# Patient Record
Sex: Male | Born: 1985
Health system: Southern US, Community
[De-identification: ages and names within clinical notes are randomized; demographics above are authoritative.]

## PROBLEM LIST (undated history)

## (undated) DIAGNOSIS — F1020 Alcohol dependence, uncomplicated: Secondary | ICD-10-CM

## (undated) DIAGNOSIS — F1120 Opioid dependence, uncomplicated: Secondary | ICD-10-CM

---
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
## 2011-10-24 ENCOUNTER — Emergency Department: Payer: Self-pay | Admitting: Emergency Medicine

## 2012-03-09 ENCOUNTER — Emergency Department: Payer: Self-pay | Admitting: Internal Medicine

## 2012-03-09 LAB — DRUG SCREEN, URINE
Barbiturates, Ur Screen: NEGATIVE (ref ?–200)
Cannabinoid 50 Ng, Ur ~~LOC~~: POSITIVE (ref ?–50)
Cocaine Metabolite,Ur ~~LOC~~: NEGATIVE (ref ?–300)
MDMA (Ecstasy)Ur Screen: NEGATIVE (ref ?–500)
Opiate, Ur Screen: NEGATIVE (ref ?–300)
Phencyclidine (PCP) Ur S: NEGATIVE (ref ?–25)
Tricyclic, Ur Screen: NEGATIVE (ref ?–1000)

## 2012-03-09 LAB — CBC
MCH: 32.7 pg (ref 26.0–34.0)
MCHC: 34 g/dL (ref 32.0–36.0)
MCV: 96 fL (ref 80–100)
Platelet: 216 10*3/uL (ref 150–440)
RBC: 4.41 10*6/uL (ref 4.40–5.90)
RDW: 12.6 % (ref 11.5–14.5)
WBC: 7.8 10*3/uL (ref 3.8–10.6)

## 2012-03-09 LAB — COMPREHENSIVE METABOLIC PANEL
Albumin: 4.5 g/dL (ref 3.4–5.0)
Alkaline Phosphatase: 84 U/L (ref 50–136)
Calcium, Total: 9.1 mg/dL (ref 8.5–10.1)
Co2: 25 mmol/L (ref 21–32)
EGFR (Non-African Amer.): >60
Glucose: 74 mg/dL (ref 65–99)
SGOT(AST): 25 U/L (ref 15–37)
SGPT (ALT): 22 U/L (ref 12–78)

## 2012-03-09 LAB — TROPONIN I: Troponin-I: <0.02 ng/mL

## 2016-06-05 DIAGNOSIS — F1510 Other stimulant abuse, uncomplicated: Secondary | ICD-10-CM | POA: Insufficient documentation

## 2016-06-05 NOTE — ED Notes (Signed)
Brother and brother in law brought pt to ED; they say pt has not been expressing any suicidal thoughts but he has been very agitated; they don't believe he will become violent but they also think it will be a good idea to have someone close by in case; officer on duty standing outside triage door for triage nurse and patient safety

## 2016-06-05 NOTE — ED Triage Notes (Signed)
Patient reports needing help with drug addiction.  Reports he has been drinking alcohol and smoking pot.  Last night used "molly" and "esctasy".

## 2016-06-06 ENCOUNTER — Emergency Department
Admission: EM | Admit: 2016-06-06 | Discharge: 2016-06-06 | Disposition: A | Discharge status: Home / Self Care | Payer: Self-pay | Attending: Emergency Medicine | Admitting: Emergency Medicine

## 2016-06-06 DIAGNOSIS — F1910 Other psychoactive substance abuse, uncomplicated: Secondary | ICD-10-CM

## 2016-06-06 DIAGNOSIS — F1510 Other stimulant abuse, uncomplicated: Secondary | ICD-10-CM

## 2016-06-06 NOTE — ED Notes (Signed)
BEHAVIORAL HEALTH ROUNDING  Patient sleeping: No.  Patient alert and oriented: yes  Behavior appropriate: Yes. ; If no, describe:  Nutrition and fluids offered: Yes  Toileting and hygiene offered: Yes  Sitter present: not applicable, Q 15 min safety rounds and observation.  Law enforcement present: Yes ODS  
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
## 2016-06-06 NOTE — ED Notes (Signed)
Pt. Changed out in triage by Susy FrizzleMatt, RN.

## 2016-06-06 NOTE — ED Notes (Signed)

## 2016-06-06 NOTE — BH Assessment (Signed)
Per EDP Dr.Stafford the patient does not require consultation. TTS Consult has been discontinued and and pt will be discharged.  Clinician did speak with pt. Pt reports h/o substance abuse (THC, Fentanyl, Alcohol, Molly) and is requesting assistance obtaining tx. Pt denies SI/HI/Psychosis. Clinician contacted [facility]([place]) and verified lack of bed availability prior to Tuesday. Clinician obtained verbal consent to contact [facility]) and provide information for coordination of treatment. Clinician counseled patient and provided him with information and instructions on accessing substance abuse treatment centers including [facility], [facility], [facility], [facility]. Clinician also informed patient of how to contact insurance panel for treatment referral.

## 2016-06-06 NOTE — ED Notes (Signed)
Pt ambulatory to room with no difficulty. Pt reports hx of addiction to fentanyl for which he was treated with suboxone and did well for a while. Pt reports he then started drinking alcohol while taking the suboxone and then he recently started using fentanyl again. Pt also reports he used molly last night. Pt requesting assistance to get of the drugs. Pt calm and cooperative at this time and understanding of the process for evaluation.

## 2017-05-31 ENCOUNTER — Ambulatory Visit (HOSPITAL_COMMUNITY): Payer: Self-pay | Admitting: Psychiatry

## 2017-06-28 ENCOUNTER — Ambulatory Visit (HOSPITAL_COMMUNITY): Payer: Self-pay | Admitting: Psychiatry

## 2017-07-25 ENCOUNTER — Ambulatory Visit (HOSPITAL_COMMUNITY): Payer: Self-pay | Admitting: Psychiatry

## 2017-07-25 ENCOUNTER — Encounter

## 2017-08-19 ENCOUNTER — Ambulatory Visit (HOSPITAL_COMMUNITY): Payer: Self-pay | Admitting: Psychiatry

## 2017-08-19 ENCOUNTER — Encounter

## 2017-12-29 ENCOUNTER — Emergency Department
Admission: EM | Admit: 2017-12-29 | Discharge: 2017-12-29 | Disposition: A | Discharge status: Home / Self Care | Payer: Commercial Managed Care - PPO | Attending: Emergency Medicine | Admitting: Emergency Medicine

## 2017-12-29 ENCOUNTER — Other Ambulatory Visit: Payer: Self-pay

## 2017-12-29 ENCOUNTER — Encounter: Payer: Self-pay | Admitting: Emergency Medicine

## 2017-12-29 DIAGNOSIS — S0502XA Injury of conjunctiva and corneal abrasion without foreign body, left eye, initial encounter: Secondary | ICD-10-CM | POA: Insufficient documentation

## 2017-12-29 DIAGNOSIS — F17210 Nicotine dependence, cigarettes, uncomplicated: Secondary | ICD-10-CM | POA: Diagnosis not present

## 2017-12-29 DIAGNOSIS — Y999 Unspecified external cause status: Secondary | ICD-10-CM | POA: Diagnosis not present

## 2017-12-29 DIAGNOSIS — S0592XA Unspecified injury of left eye and orbit, initial encounter: Secondary | ICD-10-CM | POA: Diagnosis present

## 2017-12-29 DIAGNOSIS — Y929 Unspecified place or not applicable: Secondary | ICD-10-CM | POA: Insufficient documentation

## 2017-12-29 DIAGNOSIS — Y9389 Activity, other specified: Secondary | ICD-10-CM | POA: Diagnosis not present

## 2017-12-29 DIAGNOSIS — W208XXA Other cause of strike by thrown, projected or falling object, initial encounter: Secondary | ICD-10-CM | POA: Insufficient documentation

## 2017-12-29 MED ORDER — EYE WASH OPHTH SOLN
1.0000 [drp] | OPHTHALMIC | Status: DC | PRN
  Filled 2017-12-29: qty 118

## 2017-12-29 MED ORDER — TETRACAINE HCL 0.5 % OP SOLN
1.0000 [drp] | Freq: Once | OPHTHALMIC | Status: DC
  Filled 2017-12-29: qty 4

## 2017-12-29 MED ORDER — TOBRAMYCIN 0.3 % OP SOLN: 2.0000 [drp] | OPHTHALMIC | 0 refills | Status: DC

## 2017-12-29 MED ORDER — FLUORESCEIN SODIUM 1 MG OP STRP
1.0000 | ORAL_STRIP | Freq: Once | OPHTHALMIC | Status: DC
  Filled 2017-12-29: qty 1

## 2017-12-29 NOTE — Discharge Instructions (Addendum)
Follow-up with Dr. Brooke Dare in [facility] if any continued problems with your eye in 24 hours.  Begin using the Tobrex ophthalmic solution 2 drops to your left eye every 4 hours while awake.  Wear sunglasses for any sun exposure while sensitive.  Tylenol if needed for eye pain.

## 2017-12-29 NOTE — ED Notes (Signed)
singature pad not working in room

## 2017-12-29 NOTE — ED Notes (Signed)
Eye  meds  Given by  Bridget Hartshorn    At [phone]

## 2017-12-29 NOTE — ED Notes (Signed)
Visual acuity   /  20/30  Left   20/30  Right    20/20  Both

## 2017-12-29 NOTE — ED Provider Notes (Signed)
[facility] Emergency Department Provider Note  ____________________________________________   First MD Initiated Contact with Patient 12/29/17 1150     (approximate)  I have reviewed the triage vital signs and the nursing notes.   HISTORY  Chief Complaint Eye Injury   HPI Roberto Flores is a 32 y.o. male presents to the ED with complaint of left eye pain.  Patient states that he got a piece of wood in his left eye approximately 2 hours ago.  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  He feels as though the piece of wood has come out of his eye.  He states that the vision in his left eye is somewhat blurry.  He rates his pain as a 10/10 and bright light.  History reviewed. No pertinent past medical history.  There are no active problems to display for this patient.   History reviewed. No pertinent surgical history.  Prior to Admission medications   Medication Sig Start Date End Date Taking? Authorizing Provider  tobramycin (TOBREX) 0.3 % ophthalmic solution Place 2 drops into the left eye every 4 (four) hours. 12/29/17   Tommi Rumps, PA-C    Allergies Patient has no known allergies.  No family history on file.  Social History Social History   Tobacco Use  . Smoking status: Current Every Day Smoker    Packs/day: 1.00    Types: Cigarettes  . Smokeless tobacco: Never Used  Substance Use Topics  . Alcohol use: Yes  . Drug use: Not Currently    Review of Systems Constitutional: No fever/chills Eyes: Positive eye redness and left eye pain.  Positive foreign body sensation. ENT: No trauma. Cardiovascular: Denies chest pain. Respiratory: Denies shortness of breath. Musculoskeletal: Negative for back pain. Skin: Negative for injury. Neurological: Negative for headaches ____________________________________________   PHYSICAL EXAM:  VITAL SIGNS: ED Triage Vitals  Enc Vitals Group     BP 12/29/17 1109 (!) 147/98   Pulse Rate 12/29/17 1109 99     Resp 12/29/17 1109 20     Temp 12/29/17 1109 98.2 F (36.8 C)     Temp Source 12/29/17 1109 Oral     SpO2 12/29/17 1109 99 %     Weight 12/29/17 1110 160 lb (72.6 kg)     Height 12/29/17 1110 6\' 2"  (1.88 m)     Head Circumference --      Peak Flow --      Pain Score 12/29/17 1110 10     Pain Loc --      Pain Edu? --      Excl. in GC? --    Constitutional: Alert and oriented. Well appearing and in no acute distress. Eyes: Left conjunctiva is injected.  Patient is  photophobic.  PERRL. EOMI. Head: Atraumatic. Nose: No congestion/rhinnorhea. Neck: No stridor.   Cardiovascular: Normal rate, regular rhythm. Grossly normal heart sounds.  Good peripheral circulation. Respiratory: Normal respiratory effort.  No retractions. Lungs CTAB. Musculoskeletal: Moves upper and lower extremities without any difficulty. Neurologic:  Normal speech and language. No gross focal neurologic deficits are appreciated.  Skin:  Skin is warm, dry and intact.  Psychiatric: Mood and affect are normal. Speech and behavior are normal.  ____________________________________________   LABS (all labs ordered are listed, but only abnormal results are displayed)  Labs Reviewed - No data to display   PROCEDURES  Procedure(s) performed: Tetracaine was placed in line no foreign body was noted even with the upper  lid being inverted.  Fluorescein stain was used and a superficial irregular corneal abrasion was noted at approximately the 3 o'clock position without foreign body present. Eye was was irrigated with irrigating solution.  Procedures  Critical Care performed: No  ____________________________________________   INITIAL IMPRESSION / ASSESSMENT AND PLAN / ED COURSE  As part of my medical decision making, I reviewed the following data within the electronic MEDICAL RECORD NUMBER Notes from prior ED visits and [place] Controlled Substance Database  Patient presents to the ED with  complaint of left eye foreign body sensation.  Exam is negative for foreign body but does show a corneal abrasion.  Lid was inverted with no foreign body noted there.  Area was irrigated and patient was given a prescription for Tobrex ophthalmic solution 2 drops every 4 hours while awake.  He is to follow-up with Dr. Brooke Dare who is the ophthalmologist on-call if any continued problems.  He also is aware that he will need to wear sunglasses for bright light exposure at least for the next 24 hours.  ____________________________________________   FINAL CLINICAL IMPRESSION(S) / ED DIAGNOSES  Final diagnoses:  Abrasion of left cornea, initial encounter     ED Discharge Orders         Ordered    tobramycin (TOBREX) 0.3 % ophthalmic solution  Every 4 hours     12/29/17 1220           Note:  This document was prepared using Dragon voice recognition software and may include unintentional dictation errors.    Tommi Rumps, PA-C 12/29/17 1359    Emily Filbert, MD 12/29/17 [id number]

## 2017-12-29 NOTE — ED Triage Notes (Signed)
Patient states he got a piece of wood in his left eye approx 2 hrs ago,  Sclera reddened, pain with light.  Tearing left eye.  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  Patient states his vision is blurry in his left eye.

## 2018-01-27 ENCOUNTER — Emergency Department (HOSPITAL_COMMUNITY)
Admission: EM | Admit: 2018-01-27 | Discharge: 2018-01-27 | Disposition: A | Discharge status: Home / Self Care | Payer: Commercial Managed Care - PPO | Attending: Emergency Medicine | Admitting: Emergency Medicine

## 2018-01-27 ENCOUNTER — Emergency Department (HOSPITAL_COMMUNITY): Payer: Commercial Managed Care - PPO

## 2018-01-27 ENCOUNTER — Encounter (HOSPITAL_COMMUNITY): Payer: Self-pay | Admitting: Emergency Medicine

## 2018-01-27 ENCOUNTER — Other Ambulatory Visit: Payer: Self-pay

## 2018-01-27 DIAGNOSIS — E876 Hypokalemia: Secondary | ICD-10-CM | POA: Diagnosis not present

## 2018-01-27 DIAGNOSIS — S32009A Unspecified fracture of unspecified lumbar vertebra, initial encounter for closed fracture: Secondary | ICD-10-CM | POA: Insufficient documentation

## 2018-01-27 DIAGNOSIS — S20212A Contusion of left front wall of thorax, initial encounter: Secondary | ICD-10-CM

## 2018-01-27 DIAGNOSIS — F17210 Nicotine dependence, cigarettes, uncomplicated: Secondary | ICD-10-CM | POA: Insufficient documentation

## 2018-01-27 DIAGNOSIS — R740 Nonspecific elevation of levels of transaminase and lactic acid dehydrogenase [LDH]: Secondary | ICD-10-CM | POA: Insufficient documentation

## 2018-01-27 DIAGNOSIS — Y999 Unspecified external cause status: Secondary | ICD-10-CM | POA: Diagnosis not present

## 2018-01-27 DIAGNOSIS — Y929 Unspecified place or not applicable: Secondary | ICD-10-CM | POA: Insufficient documentation

## 2018-01-27 DIAGNOSIS — R7401 Elevation of levels of liver transaminase levels: Secondary | ICD-10-CM

## 2018-01-27 DIAGNOSIS — F10929 Alcohol use, unspecified with intoxication, unspecified: Secondary | ICD-10-CM | POA: Diagnosis not present

## 2018-01-27 DIAGNOSIS — R51 Headache: Secondary | ICD-10-CM | POA: Diagnosis not present

## 2018-01-27 DIAGNOSIS — R109 Unspecified abdominal pain: Secondary | ICD-10-CM | POA: Insufficient documentation

## 2018-01-27 DIAGNOSIS — Y9389 Activity, other specified: Secondary | ICD-10-CM | POA: Insufficient documentation

## 2018-01-27 LAB — CBC WITH DIFFERENTIAL/PLATELET
ABS IMMATURE GRANULOCYTES: 0.11 10*3/uL — AB (ref 0.00–0.07)
Basophils Absolute: 0 10*3/uL (ref 0.0–0.1)
Basophils Relative: 0 %
Eosinophils Absolute: 0 10*3/uL (ref 0.0–0.5)
Eosinophils Relative: 0 %
HEMATOCRIT: 43.8 % (ref 39.0–52.0)
Hemoglobin: 14.9 g/dL (ref 13.0–17.0)
IMMATURE GRANULOCYTES: 1 %
LYMPHS ABS: 0.8 10*3/uL (ref 0.7–4.0)
LYMPHS PCT: 7 %
MCH: 33.9 pg (ref 26.0–34.0)
MCHC: 34 g/dL (ref 30.0–36.0)
MCV: 99.8 fL (ref 80.0–100.0)
MONOS PCT: 7 %
Monocytes Absolute: 0.8 10*3/uL (ref 0.1–1.0)
NEUTROS ABS: 10.7 10*3/uL — AB (ref 1.7–7.7)
NEUTROS PCT: 85 %
PLATELETS: 190 10*3/uL (ref 150–400)
RBC: 4.39 MIL/uL (ref 4.22–5.81)
RDW: 11.8 % (ref 11.5–15.5)
WBC: 12.5 10*3/uL — ABNORMAL HIGH (ref 4.0–10.5)
nRBC: 0 % (ref 0.0–0.2)

## 2018-01-27 LAB — COMPREHENSIVE METABOLIC PANEL
ALBUMIN: 4.1 g/dL (ref 3.5–5.0)
ALT: 116 U/L — ABNORMAL HIGH (ref 0–44)
AST: 388 U/L — AB (ref 15–41)
Alkaline Phosphatase: 110 U/L (ref 38–126)
Anion gap: 12 (ref 5–15)
CHLORIDE: 104 mmol/L (ref 98–111)
CO2: 23 mmol/L (ref 22–32)
Calcium: 8.8 mg/dL — ABNORMAL LOW (ref 8.9–10.3)
Creatinine, Ser: 0.7 mg/dL (ref 0.61–1.24)
GFR calc Af Amer: >60 mL/min (ref >60)
GFR calc non Af Amer: >60 mL/min (ref >60)
GLUCOSE: 130 mg/dL — AB (ref 70–99)
POTASSIUM: 2.7 mmol/L — AB (ref 3.5–5.1)
Sodium: 139 mmol/L (ref 135–145)
Total Bilirubin: 0.9 mg/dL (ref 0.3–1.2)
Total Protein: 6.7 g/dL (ref 6.5–8.1)

## 2018-01-27 LAB — ETHANOL: Alcohol, Ethyl (B): 181 mg/dL — ABNORMAL HIGH (ref <10)

## 2018-01-27 LAB — RAPID URINE DRUG SCREEN, HOSP PERFORMED
AMPHETAMINES: NOT DETECTED
BARBITURATES: NOT DETECTED
BENZODIAZEPINES: NOT DETECTED
Cocaine: NOT DETECTED
Opiates: POSITIVE — AB
Tetrahydrocannabinol: POSITIVE — AB

## 2018-01-27 LAB — I-STAT CHEM 8, ED
BUN: <3 mg/dL — ABNORMAL LOW (ref 6–20)
Calcium, Ion: 1.04 mmol/L — ABNORMAL LOW (ref 1.15–1.40)
Chloride: 102 mmol/L (ref 98–111)
Creatinine, Ser: 0.9 mg/dL (ref 0.61–1.24)
Glucose, Bld: 131 mg/dL — ABNORMAL HIGH (ref 70–99)
HEMATOCRIT: 43 % (ref 39.0–52.0)
HEMOGLOBIN: 14.6 g/dL (ref 13.0–17.0)
Potassium: 2.7 mmol/L — CL (ref 3.5–5.1)
Sodium: 141 mmol/L (ref 135–145)
TCO2: 24 mmol/L (ref 22–32)

## 2018-01-27 MED ORDER — HYDROMORPHONE HCL 1 MG/ML IJ SOLN
1.0000 mg | Freq: Once | INTRAMUSCULAR | Status: AC
  Administered 2018-01-27: 1 mg via INTRAVENOUS
  Filled 2018-01-27: qty 1

## 2018-01-27 MED ORDER — ONDANSETRON HCL 4 MG/2ML IJ SOLN
4.0000 mg | Freq: Once | INTRAMUSCULAR | Status: AC
  Administered 2018-01-27: 4 mg via INTRAVENOUS
  Filled 2018-01-27: qty 2

## 2018-01-27 MED ORDER — KETOROLAC TROMETHAMINE 30 MG/ML IJ SOLN
30.0000 mg | Freq: Once | INTRAMUSCULAR | Status: AC
  Administered 2018-01-27: 30 mg via INTRAVENOUS
  Filled 2018-01-27: qty 1

## 2018-01-27 MED ORDER — IOHEXOL 300 MG/ML  SOLN
100.0000 mL | Freq: Once | INTRAMUSCULAR | Status: AC | PRN
  Administered 2018-01-27: 100 mL via INTRAVENOUS

## 2018-01-27 MED ORDER — LIDOCAINE 5 % EX PTCH: 1.0000 | MEDICATED_PATCH | CUTANEOUS | 0 refills | Status: DC

## 2018-01-27 MED ORDER — IBUPROFEN 600 MG PO TABS: 600.0000 mg | ORAL_TABLET | Freq: Four times a day (QID) | ORAL | 0 refills | Status: DC | PRN

## 2018-01-27 MED ORDER — MORPHINE SULFATE (PF) 4 MG/ML IV SOLN
4.0000 mg | Freq: Once | INTRAVENOUS | Status: AC | PRN
  Administered 2018-01-27: 4 mg via INTRAVENOUS
  Filled 2018-01-27: qty 1

## 2018-01-27 MED ORDER — METHOCARBAMOL 500 MG PO TABS: 500.0000 mg | ORAL_TABLET | Freq: Three times a day (TID) | ORAL | 0 refills | Status: DC | PRN

## 2018-01-27 MED ORDER — POTASSIUM CHLORIDE 10 MEQ/100ML IV SOLN
10.0000 meq | Freq: Once | INTRAVENOUS | Status: AC
  Administered 2018-01-27: 10 meq via INTRAVENOUS
  Filled 2018-01-27: qty 100

## 2018-01-27 MED ORDER — POTASSIUM CHLORIDE CRYS ER 20 MEQ PO TBCR
40.0000 meq | EXTENDED_RELEASE_TABLET | Freq: Once | ORAL | Status: AC
  Administered 2018-01-27: 40 meq via ORAL
  Filled 2018-01-27: qty 2

## 2018-01-27 MED ORDER — LIDOCAINE 5 % EX PTCH
1.0000 | MEDICATED_PATCH | CUTANEOUS | Status: DC
  Administered 2018-01-27: 1 via TRANSDERMAL
  Filled 2018-01-27: qty 1
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
## 2018-01-27 NOTE — ED Notes (Signed)
Patient transported to X-ray 
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
## 2018-01-27 NOTE — ED Notes (Signed)
Pt given incentive spirometer. Instructions reviewed. Pt verbalized understanding. Pt demonstrated using device.

## 2018-01-27 NOTE — ED Notes (Signed)
Patient verbalizes understanding of discharge instructions. Opportunity for questioning and answers were provided. Armband removed by staff, pt discharged from ED In wheelchair.

## 2018-10-28 DIAGNOSIS — F1120 Opioid dependence, uncomplicated: Secondary | ICD-10-CM

## 2018-10-28 HISTORY — DX: Opioid dependence, uncomplicated: F11.20

## 2018-11-11 ENCOUNTER — Other Ambulatory Visit: Payer: Self-pay

## 2018-11-11 ENCOUNTER — Emergency Department
Admission: EM | Admit: 2018-11-11 | Discharge: 2018-11-12 | Disposition: A | Discharge status: Home / Self Care | Payer: BLUE CROSS/BLUE SHIELD | Attending: Emergency Medicine | Admitting: Emergency Medicine

## 2018-11-11 DIAGNOSIS — F1020 Alcohol dependence, uncomplicated: Secondary | ICD-10-CM | POA: Insufficient documentation

## 2018-11-11 DIAGNOSIS — F17210 Nicotine dependence, cigarettes, uncomplicated: Secondary | ICD-10-CM | POA: Diagnosis not present

## 2018-11-11 HISTORY — DX: Alcohol dependence, uncomplicated: F10.20

## 2018-11-11 NOTE — ED Triage Notes (Signed)
Patient reports seeking help with alcohol.  Last drink approximately 30 minutes prior to arrival.

## 2018-11-12 ENCOUNTER — Inpatient Hospital Stay (HOSPITAL_COMMUNITY)
Admission: EM | Admit: 2018-11-12 | Discharge: 2018-11-20 | DRG: 326 | Disposition: A | Discharge status: Home / Self Care | Payer: BLUE CROSS/BLUE SHIELD | Attending: General Surgery | Admitting: General Surgery

## 2018-11-12 ENCOUNTER — Encounter: Payer: Self-pay | Admitting: Emergency Medicine

## 2018-11-12 ENCOUNTER — Encounter (HOSPITAL_COMMUNITY): Admission: EM | Disposition: A | Discharge status: Home / Self Care | Payer: Self-pay | Source: Home / Self Care

## 2018-11-12 ENCOUNTER — Emergency Department (HOSPITAL_COMMUNITY): Payer: BLUE CROSS/BLUE SHIELD | Admitting: Anesthesiology

## 2018-11-12 ENCOUNTER — Emergency Department (HOSPITAL_COMMUNITY): Payer: BLUE CROSS/BLUE SHIELD

## 2018-11-12 ENCOUNTER — Encounter (HOSPITAL_COMMUNITY): Payer: Self-pay | Admitting: Anesthesiology

## 2018-11-12 ENCOUNTER — Other Ambulatory Visit: Payer: Self-pay

## 2018-11-12 ENCOUNTER — Encounter (HOSPITAL_COMMUNITY): Payer: Self-pay

## 2018-11-12 DIAGNOSIS — K440 Diaphragmatic hernia with obstruction, without gangrene: Secondary | ICD-10-CM | POA: Diagnosis not present

## 2018-11-12 DIAGNOSIS — K7581 Nonalcoholic steatohepatitis (NASH): Secondary | ICD-10-CM | POA: Diagnosis not present

## 2018-11-12 DIAGNOSIS — K659 Peritonitis, unspecified: Secondary | ICD-10-CM | POA: Diagnosis not present

## 2018-11-12 DIAGNOSIS — Z4682 Encounter for fitting and adjustment of non-vascular catheter: Secondary | ICD-10-CM | POA: Diagnosis not present

## 2018-11-12 DIAGNOSIS — F17210 Nicotine dependence, cigarettes, uncomplicated: Secondary | ICD-10-CM | POA: Diagnosis present

## 2018-11-12 DIAGNOSIS — K650 Generalized (acute) peritonitis: Secondary | ICD-10-CM

## 2018-11-12 DIAGNOSIS — J90 Pleural effusion, not elsewhere classified: Secondary | ICD-10-CM | POA: Diagnosis present

## 2018-11-12 DIAGNOSIS — Y906 Blood alcohol level of 120-199 mg/100 ml: Secondary | ICD-10-CM | POA: Diagnosis present

## 2018-11-12 DIAGNOSIS — R1013 Epigastric pain: Secondary | ICD-10-CM

## 2018-11-12 DIAGNOSIS — Z903 Acquired absence of stomach [part of]: Secondary | ICD-10-CM

## 2018-11-12 DIAGNOSIS — K922 Gastrointestinal hemorrhage, unspecified: Secondary | ICD-10-CM | POA: Diagnosis not present

## 2018-11-12 DIAGNOSIS — E876 Hypokalemia: Secondary | ICD-10-CM | POA: Diagnosis present

## 2018-11-12 DIAGNOSIS — R1110 Vomiting, unspecified: Secondary | ICD-10-CM | POA: Diagnosis not present

## 2018-11-12 DIAGNOSIS — Z87898 Personal history of other specified conditions: Secondary | ICD-10-CM

## 2018-11-12 DIAGNOSIS — Z781 Physical restraint status: Secondary | ICD-10-CM | POA: Diagnosis not present

## 2018-11-12 DIAGNOSIS — R112 Nausea with vomiting, unspecified: Secondary | ICD-10-CM

## 2018-11-12 DIAGNOSIS — F1010 Alcohol abuse, uncomplicated: Secondary | ICD-10-CM | POA: Diagnosis not present

## 2018-11-12 DIAGNOSIS — R197 Diarrhea, unspecified: Secondary | ICD-10-CM | POA: Diagnosis not present

## 2018-11-12 DIAGNOSIS — Z20828 Contact with and (suspected) exposure to other viral communicable diseases: Secondary | ICD-10-CM | POA: Diagnosis not present

## 2018-11-12 DIAGNOSIS — R079 Chest pain, unspecified: Secondary | ICD-10-CM | POA: Diagnosis not present

## 2018-11-12 DIAGNOSIS — R918 Other nonspecific abnormal finding of lung field: Secondary | ICD-10-CM | POA: Diagnosis not present

## 2018-11-12 DIAGNOSIS — K3189 Other diseases of stomach and duodenum: Secondary | ICD-10-CM | POA: Diagnosis present

## 2018-11-12 DIAGNOSIS — F10239 Alcohol dependence with withdrawal, unspecified: Secondary | ICD-10-CM | POA: Diagnosis present

## 2018-11-12 DIAGNOSIS — Q8909 Congenital malformations of spleen: Secondary | ICD-10-CM

## 2018-11-12 DIAGNOSIS — F1691 Hallucinogen use, unspecified, in remission: Secondary | ICD-10-CM

## 2018-11-12 DIAGNOSIS — F1910 Other psychoactive substance abuse, uncomplicated: Secondary | ICD-10-CM

## 2018-11-12 DIAGNOSIS — F1120 Opioid dependence, uncomplicated: Secondary | ICD-10-CM | POA: Diagnosis present

## 2018-11-12 DIAGNOSIS — F1290 Cannabis use, unspecified, uncomplicated: Secondary | ICD-10-CM | POA: Diagnosis not present

## 2018-11-12 DIAGNOSIS — R52 Pain, unspecified: Secondary | ICD-10-CM | POA: Diagnosis not present

## 2018-11-12 DIAGNOSIS — Z79899 Other long term (current) drug therapy: Secondary | ICD-10-CM

## 2018-11-12 DIAGNOSIS — K441 Diaphragmatic hernia with gangrene: Secondary | ICD-10-CM | POA: Insufficient documentation

## 2018-11-12 DIAGNOSIS — Z9889 Other specified postprocedural states: Secondary | ICD-10-CM

## 2018-11-12 DIAGNOSIS — K449 Diaphragmatic hernia without obstruction or gangrene: Secondary | ICD-10-CM | POA: Diagnosis not present

## 2018-11-12 DIAGNOSIS — R0789 Other chest pain: Secondary | ICD-10-CM

## 2018-11-12 DIAGNOSIS — R1084 Generalized abdominal pain: Secondary | ICD-10-CM | POA: Diagnosis not present

## 2018-11-12 DIAGNOSIS — F10929 Alcohol use, unspecified with intoxication, unspecified: Secondary | ICD-10-CM | POA: Diagnosis not present

## 2018-11-12 DIAGNOSIS — Z978 Presence of other specified devices: Secondary | ICD-10-CM

## 2018-11-12 HISTORY — PX: LAPAROSCOPIC NISSEN FUNDOPLICATION: SHX1932

## 2018-11-12 LAB — URINE DRUG SCREEN, QUALITATIVE (ARMC ONLY)
Amphetamines, Ur Screen: NOT DETECTED
Barbiturates, Ur Screen: NOT DETECTED
Benzodiazepine, Ur Scrn: NOT DETECTED
Cannabinoid 50 Ng, Ur ~~LOC~~: NOT DETECTED
Cocaine Metabolite,Ur ~~LOC~~: NOT DETECTED
MDMA (Ecstasy)Ur Screen: NOT DETECTED
Methadone Scn, Ur: NOT DETECTED
Opiate, Ur Screen: NOT DETECTED
Phencyclidine (PCP) Ur S: NOT DETECTED
Tricyclic, Ur Screen: NOT DETECTED

## 2018-11-12 LAB — RAPID URINE DRUG SCREEN, HOSP PERFORMED
Amphetamines: NOT DETECTED
Barbiturates: NOT DETECTED
Benzodiazepines: NOT DETECTED
Cocaine: NOT DETECTED
Opiates: NOT DETECTED
Tetrahydrocannabinol: POSITIVE — AB

## 2018-11-12 LAB — URINALYSIS, ROUTINE W REFLEX MICROSCOPIC
Bilirubin Urine: NEGATIVE
Glucose, UA: 50 mg/dL — AB
Hgb urine dipstick: NEGATIVE
Ketones, ur: NEGATIVE mg/dL
Leukocytes,Ua: NEGATIVE
Nitrite: NEGATIVE
Protein, ur: 100 mg/dL — AB
Specific Gravity, Urine: 1.016 (ref 1.005–1.030)
pH: 7 (ref 5.0–8.0)

## 2018-11-12 LAB — CBC WITH DIFFERENTIAL/PLATELET
Abs Immature Granulocytes: 0.03 10*3/uL (ref 0.00–0.07)
Basophils Absolute: 0 10*3/uL (ref 0.0–0.1)
Basophils Relative: 0 %
Eosinophils Absolute: 0 10*3/uL (ref 0.0–0.5)
Eosinophils Relative: 0 %
HCT: 45.5 % (ref 39.0–52.0)
Hemoglobin: 15.9 g/dL (ref 13.0–17.0)
Immature Granulocytes: 0 %
Lymphocytes Relative: 12 %
Lymphs Abs: 1 10*3/uL (ref 0.7–4.0)
MCH: 35.4 pg — ABNORMAL HIGH (ref 26.0–34.0)
MCHC: 34.9 g/dL (ref 30.0–36.0)
MCV: 101.3 fL — ABNORMAL HIGH (ref 80.0–100.0)
Monocytes Absolute: 0.4 10*3/uL (ref 0.1–1.0)
Monocytes Relative: 5 %
Neutro Abs: 6.6 10*3/uL (ref 1.7–7.7)
Neutrophils Relative %: 83 %
Platelets: 278 10*3/uL (ref 150–400)
RBC: 4.49 MIL/uL (ref 4.22–5.81)
RDW: 12.1 % (ref 11.5–15.5)
WBC: 8 10*3/uL (ref 4.0–10.5)
nRBC: 0 % (ref 0.0–0.2)

## 2018-11-12 LAB — COMPREHENSIVE METABOLIC PANEL
ALT: 41 U/L (ref 0–44)
ALT: 42 U/L (ref 0–44)
AST: 44 U/L — ABNORMAL HIGH (ref 15–41)
AST: 49 U/L — ABNORMAL HIGH (ref 15–41)
Albumin: 5 g/dL (ref 3.5–5.0)
Albumin: 5.2 g/dL — ABNORMAL HIGH (ref 3.5–5.0)
Alkaline Phosphatase: 105 U/L (ref 38–126)
Alkaline Phosphatase: 94 U/L (ref 38–126)
Anion gap: 13 (ref 5–15)
Anion gap: 14 (ref 5–15)
BUN: <5 mg/dL — ABNORMAL LOW (ref 6–20)
BUN: <5 mg/dL — ABNORMAL LOW (ref 6–20)
CO2: 19 mmol/L — ABNORMAL LOW (ref 22–32)
CO2: 23 mmol/L (ref 22–32)
Calcium: 9.1 mg/dL (ref 8.9–10.3)
Calcium: 9.5 mg/dL (ref 8.9–10.3)
Chloride: 105 mmol/L (ref 98–111)
Chloride: 109 mmol/L (ref 98–111)
Creatinine, Ser: 0.65 mg/dL (ref 0.61–1.24)
Creatinine, Ser: 0.66 mg/dL (ref 0.61–1.24)
GFR calc Af Amer: >60 mL/min (ref >60)
GFR calc Af Amer: >60 mL/min (ref >60)
GFR calc non Af Amer: >60 mL/min (ref >60)
GFR calc non Af Amer: >60 mL/min (ref >60)
Glucose, Bld: 152 mg/dL — ABNORMAL HIGH (ref 70–99)
Glucose, Bld: 154 mg/dL — ABNORMAL HIGH (ref 70–99)
Potassium: 3 mmol/L — ABNORMAL LOW (ref 3.5–5.1)
Potassium: 3.3 mmol/L — ABNORMAL LOW (ref 3.5–5.1)
Sodium: 141 mmol/L (ref 135–145)
Sodium: 142 mmol/L (ref 135–145)
Total Bilirubin: 0.6 mg/dL (ref 0.3–1.2)
Total Bilirubin: 0.6 mg/dL (ref 0.3–1.2)
Total Protein: 8 g/dL (ref 6.5–8.1)
Total Protein: 8.4 g/dL — ABNORMAL HIGH (ref 6.5–8.1)

## 2018-11-12 LAB — CBC
HCT: 45.9 % (ref 39.0–52.0)
Hemoglobin: 16.6 g/dL (ref 13.0–17.0)
MCH: 35.9 pg — ABNORMAL HIGH (ref 26.0–34.0)
MCHC: 36.2 g/dL — ABNORMAL HIGH (ref 30.0–36.0)
MCV: 99.1 fL (ref 80.0–100.0)
Platelets: 275 10*3/uL (ref 150–400)
RBC: 4.63 MIL/uL (ref 4.22–5.81)
RDW: 11.9 % (ref 11.5–15.5)
WBC: 8.4 10*3/uL (ref 4.0–10.5)
nRBC: 0 % (ref 0.0–0.2)

## 2018-11-12 LAB — SARS CORONAVIRUS 2 BY RT PCR (HOSPITAL ORDER, PERFORMED IN ~~LOC~~ HOSPITAL LAB): SARS Coronavirus 2: NEGATIVE

## 2018-11-12 LAB — TYPE AND SCREEN
ABO/RH(D): A POS
Antibody Screen: NEGATIVE

## 2018-11-12 LAB — ETHANOL
Alcohol, Ethyl (B): 189 mg/dL — ABNORMAL HIGH (ref <10)
Alcohol, Ethyl (B): 245 mg/dL — ABNORMAL HIGH (ref <10)

## 2018-11-12 LAB — ACETAMINOPHEN LEVEL: Acetaminophen (Tylenol), Serum: <10 ug/mL — ABNORMAL LOW (ref 10–30)

## 2018-11-12 LAB — MRSA PCR SCREENING: MRSA by PCR: NEGATIVE

## 2018-11-12 LAB — TROPONIN I (HIGH SENSITIVITY)
Troponin I (High Sensitivity): 2 ng/L (ref <18)
Troponin I (High Sensitivity): 2 ng/L (ref <18)

## 2018-11-12 LAB — SALICYLATE LEVEL: Salicylate Lvl: <7 mg/dL (ref 2.8–30.0)

## 2018-11-12 LAB — LIPASE, BLOOD: Lipase: 23 U/L (ref 11–51)

## 2018-11-12 SURGERY — FUNDOPLICATION, NISSEN, LAPAROSCOPIC
Anesthesia: General

## 2018-11-12 MED ORDER — FENTANYL CITRATE (PF) 100 MCG/2ML IJ SOLN
25.0000 ug | INTRAMUSCULAR | Status: DC | PRN
  Administered 2018-11-12 (×2): 50 ug via INTRAVENOUS
  Filled 2018-11-12 (×2): qty 2

## 2018-11-12 MED ORDER — DEXAMETHASONE SODIUM PHOSPHATE 10 MG/ML IJ SOLN
INTRAMUSCULAR | Status: AC
  Filled 2018-11-12: qty 1

## 2018-11-12 MED ORDER — ALBUMIN HUMAN 5 % IV SOLN
INTRAVENOUS | Status: DC | PRN
  Administered 2018-11-12: 17:00:00 via INTRAVENOUS

## 2018-11-12 MED ORDER — HYDROMORPHONE HCL 1 MG/ML IJ SOLN
1.0000 mg | Freq: Once | INTRAMUSCULAR | Status: AC
  Administered 2018-11-12: 1 mg via INTRAVENOUS
  Filled 2018-11-12: qty 1

## 2018-11-12 MED ORDER — FENTANYL CITRATE (PF) 100 MCG/2ML IJ SOLN
INTRAMUSCULAR | Status: AC
  Filled 2018-11-12: qty 2

## 2018-11-12 MED ORDER — LACTATED RINGERS IV SOLN
INTRAVENOUS | Status: DC
  Administered 2018-11-12 – 2018-11-17 (×8): via INTRAVENOUS

## 2018-11-12 MED ORDER — ROCURONIUM BROMIDE 10 MG/ML (PF) SYRINGE
PREFILLED_SYRINGE | INTRAVENOUS | Status: AC
  Filled 2018-11-12: qty 10

## 2018-11-12 MED ORDER — VITAMIN B-1 100 MG PO TABS
100.0000 mg | ORAL_TABLET | Freq: Every day | ORAL | Status: DC
  Administered 2018-11-15 – 2018-11-20 (×6): 100 mg via ORAL
  Filled 2018-11-12 (×6): qty 1

## 2018-11-12 MED ORDER — DEXMEDETOMIDINE HCL IN NACL 200 MCG/50ML IV SOLN
INTRAVENOUS | Status: DC | PRN
  Administered 2018-11-12 (×3): 8 ug via INTRAVENOUS
  Administered 2018-11-12: 12 ug via INTRAVENOUS

## 2018-11-12 MED ORDER — DEXMEDETOMIDINE HCL IN NACL 200 MCG/50ML IV SOLN
0.2000 ug/kg/h | INTRAVENOUS | Status: DC
  Administered 2018-11-12: 0.2 ug/kg/h via INTRAVENOUS
  Administered 2018-11-13 – 2018-11-14 (×8): 0.5 ug/kg/h via INTRAVENOUS
  Administered 2018-11-14: 0.3 ug/kg/h via INTRAVENOUS
  Administered 2018-11-15: 0.4 ug/kg/h via INTRAVENOUS
  Administered 2018-11-15 (×2): 0.5 ug/kg/h via INTRAVENOUS
  Filled 2018-11-12 (×14): qty 50

## 2018-11-12 MED ORDER — THIAMINE HCL 100 MG/ML IJ SOLN
100.0000 mg | Freq: Every day | INTRAMUSCULAR | Status: DC
  Administered 2018-11-13 – 2018-11-14 (×2): 100 mg via INTRAVENOUS
  Filled 2018-11-12 (×3): qty 2

## 2018-11-12 MED ORDER — DEXMEDETOMIDINE HCL IN NACL 400 MCG/100ML IV SOLN
INTRAVENOUS | Status: DC | PRN
  Administered 2018-11-12: .7 ug/kg/h via INTRAVENOUS

## 2018-11-12 MED ORDER — ADULT MULTIVITAMIN W/MINERALS CH
1.0000 | ORAL_TABLET | Freq: Every day | ORAL | Status: DC
  Administered 2018-11-15 – 2018-11-20 (×6): 1 via ORAL
  Filled 2018-11-12 (×6): qty 1

## 2018-11-12 MED ORDER — HYDROCORTISONE 1 % EX CREA
1.0000 "application " | TOPICAL_CREAM | Freq: Three times a day (TID) | CUTANEOUS | Status: DC | PRN
  Filled 2018-11-12: qty 28

## 2018-11-12 MED ORDER — DICYCLOMINE HCL 10 MG PO CAPS
20.0000 mg | ORAL_CAPSULE | Freq: Four times a day (QID) | ORAL | Status: DC | PRN
  Administered 2018-11-12 – 2018-11-18 (×4): 20 mg via ORAL
  Filled 2018-11-12 (×6): qty 2

## 2018-11-12 MED ORDER — PROPOFOL 10 MG/ML IV BOLUS
INTRAVENOUS | Status: DC | PRN
  Administered 2018-11-12: 200 mg via INTRAVENOUS

## 2018-11-12 MED ORDER — LIDOCAINE 2% (20 MG/ML) 5 ML SYRINGE
INTRAMUSCULAR | Status: AC
  Filled 2018-11-12: qty 5

## 2018-11-12 MED ORDER — FENTANYL CITRATE (PF) 100 MCG/2ML IJ SOLN
INTRAMUSCULAR | Status: DC | PRN
  Administered 2018-11-12 (×2): 100 ug via INTRAVENOUS
  Administered 2018-11-12: 200 ug via INTRAVENOUS
  Administered 2018-11-12: 50 ug via INTRAVENOUS
  Administered 2018-11-12: 100 ug via INTRAVENOUS
  Administered 2018-11-12: 50 ug via INTRAVENOUS
  Administered 2018-11-12: 100 ug via INTRAVENOUS

## 2018-11-12 MED ORDER — PANTOPRAZOLE SODIUM 40 MG IV SOLR
40.0000 mg | Freq: Once | INTRAVENOUS | Status: AC
  Administered 2018-11-12: 40 mg via INTRAVENOUS
  Filled 2018-11-12: qty 40

## 2018-11-12 MED ORDER — LABETALOL HCL 5 MG/ML IV SOLN
INTRAVENOUS | Status: DC | PRN
  Administered 2018-11-12 (×4): 5 mg via INTRAVENOUS

## 2018-11-12 MED ORDER — SODIUM CHLORIDE 0.9 % IV BOLUS
1000.0000 mL | Freq: Once | INTRAVENOUS | Status: AC
  Administered 2018-11-12: 1000 mL via INTRAVENOUS

## 2018-11-12 MED ORDER — LORAZEPAM 2 MG/ML IJ SOLN
INTRAMUSCULAR | Status: AC
  Administered 2018-11-12: 1 mg via INTRAVENOUS
  Filled 2018-11-12: qty 1

## 2018-11-12 MED ORDER — KETAMINE HCL 10 MG/ML IJ SOLN
INTRAMUSCULAR | Status: DC | PRN
  Administered 2018-11-12: 40 mg via INTRAVENOUS
  Administered 2018-11-12: 80 mg via INTRAVENOUS

## 2018-11-12 MED ORDER — LACTATED RINGERS IV SOLN
INTRAVENOUS | Status: AC | PRN
  Administered 2018-11-12: 2000 mL

## 2018-11-12 MED ORDER — ONDANSETRON HCL 4 MG/2ML IJ SOLN
INTRAMUSCULAR | Status: AC
  Filled 2018-11-12: qty 6

## 2018-11-12 MED ORDER — SODIUM CHLORIDE 0.9 % IV SOLN
8.0000 mg | Freq: Four times a day (QID) | INTRAVENOUS | Status: DC | PRN
  Filled 2018-11-12: qty 4

## 2018-11-12 MED ORDER — PHENYLEPHRINE 40 MCG/ML (10ML) SYRINGE FOR IV PUSH (FOR BLOOD PRESSURE SUPPORT)
PREFILLED_SYRINGE | INTRAVENOUS | Status: DC | PRN
  Administered 2018-11-12: 80 ug via INTRAVENOUS

## 2018-11-12 MED ORDER — SUCCINYLCHOLINE CHLORIDE 200 MG/10ML IV SOSY
PREFILLED_SYRINGE | INTRAVENOUS | Status: DC | PRN
  Administered 2018-11-12: 140 mg via INTRAVENOUS

## 2018-11-12 MED ORDER — ALBUMIN HUMAN 5 % IV SOLN
INTRAVENOUS | Status: AC
  Filled 2018-11-12: qty 250

## 2018-11-12 MED ORDER — SUFENTANIL CITRATE 50 MCG/ML IV SOLN
INTRAVENOUS | Status: AC
  Filled 2018-11-12: qty 1

## 2018-11-12 MED ORDER — SODIUM CHLORIDE 0.9 % IV SOLN
2.0000 g | INTRAVENOUS | Status: AC
  Administered 2018-11-12: 2 g via INTRAVENOUS
  Filled 2018-11-12: qty 20

## 2018-11-12 MED ORDER — PROCHLORPERAZINE EDISYLATE 10 MG/2ML IJ SOLN
5.0000 mg | INTRAMUSCULAR | Status: DC | PRN
  Administered 2018-11-12: 10 mg via INTRAVENOUS
  Filled 2018-11-12: qty 2

## 2018-11-12 MED ORDER — LIDOCAINE VISCOUS HCL 2 % MT SOLN
15.0000 mL | Freq: Once | OROMUCOSAL | Status: AC
  Administered 2018-11-12: 15 mL via ORAL
  Filled 2018-11-12: qty 15

## 2018-11-12 MED ORDER — CLONIDINE HCL 0.1 MG PO TABS
0.1000 mg | ORAL_TABLET | Freq: Once | ORAL | Status: AC
  Administered 2018-11-12: 0.1 mg via ORAL
  Filled 2018-11-12: qty 1

## 2018-11-12 MED ORDER — LORAZEPAM 2 MG/ML IJ SOLN
0.0000 mg | Freq: Two times a day (BID) | INTRAMUSCULAR | Status: DC
  Administered 2018-11-14 – 2018-11-15 (×2): 2 mg via INTRAVENOUS
  Filled 2018-11-12 (×3): qty 1

## 2018-11-12 MED ORDER — FENTANYL CITRATE (PF) 250 MCG/5ML IJ SOLN
INTRAMUSCULAR | Status: AC
  Filled 2018-11-12: qty 5

## 2018-11-12 MED ORDER — CHLORHEXIDINE GLUCONATE CLOTH 2 % EX PADS
6.0000 | MEDICATED_PAD | Freq: Every day | CUTANEOUS | Status: DC
  Administered 2018-11-13 – 2018-11-16 (×4): 6 via TOPICAL

## 2018-11-12 MED ORDER — HYDRALAZINE HCL 20 MG/ML IJ SOLN
5.0000 mg | INTRAMUSCULAR | Status: DC | PRN
  Administered 2018-11-13: 10 mg via INTRAVENOUS
  Filled 2018-11-12: qty 1

## 2018-11-12 MED ORDER — SCOPOLAMINE 1 MG/3DAYS TD PT72
MEDICATED_PATCH | TRANSDERMAL | Status: DC | PRN
  Administered 2018-11-12: 1 via TRANSDERMAL

## 2018-11-12 MED ORDER — PROMETHAZINE HCL 25 MG/ML IJ SOLN
25.0000 mg | Freq: Once | INTRAMUSCULAR | Status: DC
  Filled 2018-11-12: qty 1

## 2018-11-12 MED ORDER — LORAZEPAM 2 MG/ML IJ SOLN
1.0000 mg | Freq: Four times a day (QID) | INTRAMUSCULAR | Status: AC | PRN
  Administered 2018-11-15: 1 mg via INTRAVENOUS
  Filled 2018-11-12: qty 0.5
  Filled 2018-11-12: qty 1

## 2018-11-12 MED ORDER — ONDANSETRON HCL 4 MG/2ML IJ SOLN
INTRAMUSCULAR | Status: DC | PRN
  Administered 2018-11-12: 4 mg via INTRAVENOUS

## 2018-11-12 MED ORDER — METRONIDAZOLE IN NACL 5-0.79 MG/ML-% IV SOLN
500.0000 mg | Freq: Three times a day (TID) | INTRAVENOUS | Status: DC
  Administered 2018-11-12: 500 mg via INTRAVENOUS
  Filled 2018-11-12: qty 100

## 2018-11-12 MED ORDER — SUFENTANIL CITRATE 50 MCG/ML IV SOLN
INTRAVENOUS | Status: DC | PRN
  Administered 2018-11-12: 10 ug via INTRAVENOUS
  Administered 2018-11-12 (×4): 5 ug via INTRAVENOUS
  Administered 2018-11-12 (×2): 10 ug via INTRAVENOUS
  Administered 2018-11-12 (×2): 5 ug via INTRAVENOUS

## 2018-11-12 MED ORDER — IOHEXOL 350 MG/ML SOLN: 100.0000 mL | Freq: Once | INTRAVENOUS | Status: DC | PRN

## 2018-11-12 MED ORDER — ALUM & MAG HYDROXIDE-SIMETH 200-200-20 MG/5ML PO SUSP: 30.0000 mL | Freq: Four times a day (QID) | ORAL | Status: DC | PRN

## 2018-11-12 MED ORDER — DEXMEDETOMIDINE HCL IN NACL 200 MCG/50ML IV SOLN
INTRAVENOUS | Status: AC
  Filled 2018-11-12: qty 100

## 2018-11-12 MED ORDER — ONDANSETRON 4 MG PO TBDP: 4.0000 mg | ORAL_TABLET | Freq: Three times a day (TID) | ORAL | 0 refills | Status: DC | PRN

## 2018-11-12 MED ORDER — PANTOPRAZOLE SODIUM 40 MG IV SOLR
40.0000 mg | Freq: Two times a day (BID) | INTRAVENOUS | Status: DC
  Administered 2018-11-12 – 2018-11-16 (×9): 40 mg via INTRAVENOUS
  Filled 2018-11-12 (×9): qty 40

## 2018-11-12 MED ORDER — LIDOCAINE 2% (20 MG/ML) 5 ML SYRINGE
INTRAMUSCULAR | Status: DC | PRN
  Administered 2018-11-12: 100 mg via INTRAVENOUS

## 2018-11-12 MED ORDER — HYDROCORTISONE (PERIANAL) 2.5 % EX CREA
1.0000 "application " | TOPICAL_CREAM | Freq: Four times a day (QID) | CUTANEOUS | Status: DC | PRN
  Filled 2018-11-12: qty 28.35

## 2018-11-12 MED ORDER — LIP MEDEX EX OINT
1.0000 "application " | TOPICAL_OINTMENT | Freq: Two times a day (BID) | CUTANEOUS | Status: DC
  Administered 2018-11-13 – 2018-11-20 (×14): 1 via TOPICAL
  Filled 2018-11-12 (×4): qty 7

## 2018-11-12 MED ORDER — PROPOFOL 10 MG/ML IV BOLUS
INTRAVENOUS | Status: AC
  Filled 2018-11-12: qty 20

## 2018-11-12 MED ORDER — MIDAZOLAM HCL 5 MG/5ML IJ SOLN
INTRAMUSCULAR | Status: DC | PRN
  Administered 2018-11-12: 1 mg via INTRAVENOUS
  Administered 2018-11-12: 2 mg via INTRAVENOUS
  Administered 2018-11-12: 1 mg via INTRAVENOUS

## 2018-11-12 MED ORDER — BUPIVACAINE-EPINEPHRINE (PF) 0.25% -1:200000 IJ SOLN
INTRAMUSCULAR | Status: AC
  Filled 2018-11-12: qty 60

## 2018-11-12 MED ORDER — DICYCLOMINE HCL 20 MG PO TABS: 20.0000 mg | ORAL_TABLET | Freq: Two times a day (BID) | ORAL | 0 refills | Status: DC

## 2018-11-12 MED ORDER — METHOCARBAMOL 500 MG PO TABS: 500.0000 mg | ORAL_TABLET | Freq: Three times a day (TID) | ORAL | Status: DC | PRN

## 2018-11-12 MED ORDER — BUPIVACAINE-EPINEPHRINE 0.25% -1:200000 IJ SOLN
INTRAMUSCULAR | Status: DC | PRN
  Administered 2018-11-12: 30 mL

## 2018-11-12 MED ORDER — MAGIC MOUTHWASH
15.0000 mL | Freq: Four times a day (QID) | ORAL | Status: DC | PRN
  Filled 2018-11-12: qty 15

## 2018-11-12 MED ORDER — ENOXAPARIN SODIUM 40 MG/0.4ML ~~LOC~~ SOLN
40.0000 mg | SUBCUTANEOUS | Status: DC
  Administered 2018-11-13 – 2018-11-20 (×8): 40 mg via SUBCUTANEOUS
  Filled 2018-11-12 (×8): qty 0.4

## 2018-11-12 MED ORDER — LORAZEPAM 1 MG PO TABS: 1.0000 mg | ORAL_TABLET | Freq: Four times a day (QID) | ORAL | Status: AC | PRN

## 2018-11-12 MED ORDER — PHENYLEPHRINE 40 MCG/ML (10ML) SYRINGE FOR IV PUSH (FOR BLOOD PRESSURE SUPPORT)
PREFILLED_SYRINGE | INTRAVENOUS | Status: AC
  Filled 2018-11-12: qty 10

## 2018-11-12 MED ORDER — POTASSIUM CHLORIDE CRYS ER 20 MEQ PO TBCR: 40.0000 meq | EXTENDED_RELEASE_TABLET | Freq: Once | ORAL | Status: DC

## 2018-11-12 MED ORDER — FOLIC ACID 1 MG PO TABS
1.0000 mg | ORAL_TABLET | Freq: Every day | ORAL | Status: DC
  Administered 2018-11-15 – 2018-11-20 (×6): 1 mg via ORAL
  Filled 2018-11-12 (×6): qty 1

## 2018-11-12 MED ORDER — OXYCODONE HCL 5 MG PO TABS: 5.0000 mg | ORAL_TABLET | Freq: Once | ORAL | Status: DC | PRN

## 2018-11-12 MED ORDER — DIPHENHYDRAMINE HCL 50 MG/ML IJ SOLN
12.5000 mg | Freq: Four times a day (QID) | INTRAMUSCULAR | Status: DC | PRN
  Administered 2018-11-15 – 2018-11-18 (×3): 25 mg via INTRAVENOUS
  Filled 2018-11-12 (×3): qty 1

## 2018-11-12 MED ORDER — DEXAMETHASONE SODIUM PHOSPHATE 10 MG/ML IJ SOLN
INTRAMUSCULAR | Status: DC | PRN
  Administered 2018-11-12: 10 mg via INTRAVENOUS

## 2018-11-12 MED ORDER — ONDANSETRON HCL 4 MG/2ML IJ SOLN
INTRAMUSCULAR | Status: AC
  Filled 2018-11-12: qty 2

## 2018-11-12 MED ORDER — LIDOCAINE 2% (20 MG/ML) 5 ML SYRINGE
INTRAMUSCULAR | Status: DC | PRN
  Administered 2018-11-12: 1.5 mg/kg/h via INTRAVENOUS

## 2018-11-12 MED ORDER — MIDAZOLAM HCL 2 MG/2ML IJ SOLN
INTRAMUSCULAR | Status: AC
  Filled 2018-11-12: qty 2

## 2018-11-12 MED ORDER — OMEPRAZOLE 20 MG PO CPDR: 20.0000 mg | DELAYED_RELEASE_CAPSULE | Freq: Every day | ORAL | 0 refills | Status: DC

## 2018-11-12 MED ORDER — NICOTINE 14 MG/24HR TD PT24
14.0000 mg | MEDICATED_PATCH | Freq: Every day | TRANSDERMAL | Status: DC
  Administered 2018-11-13 – 2018-11-20 (×9): 14 mg via TRANSDERMAL
  Filled 2018-11-12 (×9): qty 1

## 2018-11-12 MED ORDER — PHENOL 1.4 % MT LIQD: 1.0000 | OROMUCOSAL | Status: DC | PRN

## 2018-11-12 MED ORDER — SUCCINYLCHOLINE CHLORIDE 200 MG/10ML IV SOSY
PREFILLED_SYRINGE | INTRAVENOUS | Status: AC
  Filled 2018-11-12: qty 10

## 2018-11-12 MED ORDER — METHOCARBAMOL 1000 MG/10ML IJ SOLN
1000.0000 mg | Freq: Four times a day (QID) | INTRAVENOUS | Status: DC | PRN
  Administered 2018-11-14 – 2018-11-17 (×10): 1000 mg via INTRAVENOUS
  Filled 2018-11-12 (×10): qty 10

## 2018-11-12 MED ORDER — LACTATED RINGERS IV BOLUS
1000.0000 mL | Freq: Three times a day (TID) | INTRAVENOUS | Status: AC | PRN
  Administered 2018-11-14: 1000 mL via INTRAVENOUS

## 2018-11-12 MED ORDER — NAPROXEN 250 MG PO TABS: 500.0000 mg | ORAL_TABLET | Freq: Two times a day (BID) | ORAL | Status: DC | PRN

## 2018-11-12 MED ORDER — LORAZEPAM 2 MG/ML IJ SOLN
1.0000 mg | Freq: Once | INTRAMUSCULAR | Status: AC
  Administered 2018-11-12: 1 mg via INTRAVENOUS

## 2018-11-12 MED ORDER — DICYCLOMINE HCL 20 MG PO TABS
20.0000 mg | ORAL_TABLET | Freq: Four times a day (QID) | ORAL | Status: DC | PRN
  Filled 2018-11-12: qty 1

## 2018-11-12 MED ORDER — SCOPOLAMINE 1 MG/3DAYS TD PT72
MEDICATED_PATCH | TRANSDERMAL | Status: AC
  Filled 2018-11-12: qty 1

## 2018-11-12 MED ORDER — LACTATED RINGERS IV BOLUS
1000.0000 mL | Freq: Once | INTRAVENOUS | Status: AC
  Administered 2018-11-12: 1000 mL via INTRAVENOUS

## 2018-11-12 MED ORDER — BUPIVACAINE LIPOSOME 1.3 % IJ SUSP
20.0000 mL | Freq: Once | INTRAMUSCULAR | Status: DC
  Filled 2018-11-12: qty 20

## 2018-11-12 MED ORDER — HYDROMORPHONE HCL 1 MG/ML IJ SOLN
0.5000 mg | INTRAMUSCULAR | Status: DC | PRN
  Administered 2018-11-12 – 2018-11-15 (×23): 2 mg via INTRAVENOUS
  Administered 2018-11-15: 1 mg via INTRAVENOUS
  Administered 2018-11-15 (×3): 2 mg via INTRAVENOUS
  Administered 2018-11-15: 1 mg via INTRAVENOUS
  Administered 2018-11-15 – 2018-11-16 (×4): 2 mg via INTRAVENOUS
  Administered 2018-11-16: 1 mg via INTRAVENOUS
  Filled 2018-11-12 (×2): qty 2
  Filled 2018-11-12: qty 1
  Filled 2018-11-12 (×12): qty 2
  Filled 2018-11-12: qty 1
  Filled 2018-11-12 (×18): qty 2

## 2018-11-12 MED ORDER — LACTATED RINGERS IV SOLN
INTRAVENOUS | Status: DC | PRN
  Administered 2018-11-12 (×4): via INTRAVENOUS

## 2018-11-12 MED ORDER — ROCURONIUM BROMIDE 10 MG/ML (PF) SYRINGE
PREFILLED_SYRINGE | INTRAVENOUS | Status: DC | PRN
  Administered 2018-11-12 (×4): 20 mg via INTRAVENOUS
  Administered 2018-11-12: 10 mg via INTRAVENOUS
  Administered 2018-11-12: 20 mg via INTRAVENOUS
  Administered 2018-11-12: 60 mg via INTRAVENOUS
  Administered 2018-11-12 (×2): 20 mg via INTRAVENOUS

## 2018-11-12 MED ORDER — ONDANSETRON HCL 4 MG/2ML IJ SOLN
4.0000 mg | Freq: Four times a day (QID) | INTRAMUSCULAR | Status: DC | PRN
  Administered 2018-11-14 – 2018-11-18 (×2): 4 mg via INTRAVENOUS
  Filled 2018-11-12 (×2): qty 2

## 2018-11-12 MED ORDER — IOHEXOL 300 MG/ML  SOLN
100.0000 mL | Freq: Once | INTRAMUSCULAR | Status: AC | PRN
  Administered 2018-11-12: 100 mL via INTRAVENOUS

## 2018-11-12 MED ORDER — ONDANSETRON HCL 4 MG/2ML IJ SOLN: 4.0000 mg | Freq: Four times a day (QID) | INTRAMUSCULAR | Status: DC | PRN

## 2018-11-12 MED ORDER — SUGAMMADEX SODIUM 500 MG/5ML IV SOLN
INTRAVENOUS | Status: DC | PRN
  Administered 2018-11-12: 200 mg via INTRAVENOUS

## 2018-11-12 MED ORDER — OXYCODONE HCL 5 MG/5ML PO SOLN: 5.0000 mg | Freq: Once | ORAL | Status: DC | PRN

## 2018-11-12 MED ORDER — ACETAMINOPHEN 650 MG RE SUPP: 650.0000 mg | Freq: Four times a day (QID) | RECTAL | Status: DC | PRN

## 2018-11-12 MED ORDER — ALUM & MAG HYDROXIDE-SIMETH 200-200-20 MG/5ML PO SUSP
30.0000 mL | Freq: Once | ORAL | Status: AC
  Administered 2018-11-12: 30 mL via ORAL
  Filled 2018-11-12: qty 30

## 2018-11-12 MED ORDER — 0.9 % SODIUM CHLORIDE (POUR BTL) OPTIME
TOPICAL | Status: DC | PRN
  Administered 2018-11-12: 3000 mL
  Administered 2018-11-12: 1000 mL

## 2018-11-12 MED ORDER — HYDROXYZINE HCL 25 MG PO TABS
25.0000 mg | ORAL_TABLET | Freq: Four times a day (QID) | ORAL | Status: AC | PRN
  Administered 2018-11-12: 25 mg via ORAL
  Filled 2018-11-12: qty 1

## 2018-11-12 MED ORDER — STERILE WATER FOR IRRIGATION IR SOLN
Status: DC | PRN
  Administered 2018-11-12: 1000 mL

## 2018-11-12 MED ORDER — LOPERAMIDE HCL 2 MG PO CAPS
2.0000 mg | ORAL_CAPSULE | ORAL | Status: DC | PRN
  Administered 2018-11-12: 2 mg via ORAL
  Filled 2018-11-12: qty 1

## 2018-11-12 MED ORDER — LACTATED RINGERS IV BOLUS: 1000.0000 mL | Freq: Three times a day (TID) | INTRAVENOUS | Status: DC | PRN

## 2018-11-12 MED ORDER — PROMETHAZINE HCL 25 MG/ML IJ SOLN
25.0000 mg | Freq: Once | INTRAMUSCULAR | Status: AC
  Administered 2018-11-12: 25 mg via INTRAVENOUS

## 2018-11-12 MED ORDER — SODIUM CHLORIDE (PF) 0.9 % IJ SOLN
INTRAMUSCULAR | Status: AC
  Filled 2018-11-12: qty 10

## 2018-11-12 MED ORDER — ORAL CARE MOUTH RINSE
15.0000 mL | Freq: Two times a day (BID) | OROMUCOSAL | Status: DC
  Administered 2018-11-13 – 2018-11-20 (×11): 15 mL via OROMUCOSAL

## 2018-11-12 MED ORDER — MENTHOL 3 MG MT LOZG
1.0000 | LOZENGE | OROMUCOSAL | Status: DC | PRN
  Filled 2018-11-12: qty 9

## 2018-11-12 MED ORDER — LORAZEPAM 2 MG/ML IJ SOLN
0.0000 mg | Freq: Four times a day (QID) | INTRAMUSCULAR | Status: DC
  Administered 2018-11-12: 13:00:00 1 mg via INTRAVENOUS
  Administered 2018-11-13: 2 mg via INTRAVENOUS
  Administered 2018-11-13: 1 mg via INTRAVENOUS
  Administered 2018-11-14: 2 mg via INTRAVENOUS
  Filled 2018-11-12 (×2): qty 1

## 2018-11-12 MED ORDER — METOCLOPRAMIDE HCL 5 MG/ML IJ SOLN: 5.0000 mg | Freq: Three times a day (TID) | INTRAMUSCULAR | Status: DC | PRN

## 2018-11-12 MED ORDER — BISACODYL 10 MG RE SUPP: 10.0000 mg | Freq: Two times a day (BID) | RECTAL | Status: DC | PRN

## 2018-11-12 MED ORDER — KETAMINE HCL 10 MG/ML IJ SOLN
INTRAMUSCULAR | Status: AC
  Filled 2018-11-12: qty 1

## 2018-11-12 MED ORDER — FAMOTIDINE IN NACL 20-0.9 MG/50ML-% IV SOLN
20.0000 mg | Freq: Once | INTRAVENOUS | Status: AC
  Administered 2018-11-12: 20 mg via INTRAVENOUS
  Filled 2018-11-12: qty 50

## 2018-11-12 MED ORDER — MIDAZOLAM HCL 2 MG/2ML IJ SOLN
2.0000 mg | Freq: Once | INTRAMUSCULAR | Status: AC
  Administered 2018-11-12: 2 mg via INTRAVENOUS

## 2018-11-12 MED ORDER — FENTANYL CITRATE (PF) 100 MCG/2ML IJ SOLN: 25.0000 ug | INTRAMUSCULAR | Status: DC | PRN

## 2018-11-12 MED ORDER — GUAIFENESIN-DM 100-10 MG/5ML PO SYRP: 10.0000 mL | ORAL_SOLUTION | ORAL | Status: DC | PRN

## 2018-11-12 MED ORDER — ONDANSETRON HCL 4 MG/2ML IJ SOLN
4.0000 mg | Freq: Once | INTRAMUSCULAR | Status: AC
  Administered 2018-11-12: 4 mg via INTRAVENOUS
  Filled 2018-11-12: qty 2

## 2018-11-12 MED ORDER — PIPERACILLIN-TAZOBACTAM 3.375 G IVPB
3.3750 g | Freq: Three times a day (TID) | INTRAVENOUS | Status: AC
  Administered 2018-11-13 – 2018-11-16 (×12): 3.375 g via INTRAVENOUS
  Filled 2018-11-12 (×12): qty 50

## 2018-11-12 MED ORDER — HALOPERIDOL LACTATE 5 MG/ML IJ SOLN
2.0000 mg | Freq: Four times a day (QID) | INTRAMUSCULAR | Status: AC | PRN
  Administered 2018-11-13: 5 mg via INTRAVENOUS
  Filled 2018-11-12: qty 1

## 2018-11-12 MED ORDER — LACTATED RINGERS IV SOLN
INTRAVENOUS | Status: DC | PRN
  Administered 2018-11-12: 16:00:00 via INTRAVENOUS

## 2018-11-12 MED ORDER — ONDANSETRON 4 MG PO TBDP
4.0000 mg | ORAL_TABLET | Freq: Four times a day (QID) | ORAL | Status: AC | PRN
  Administered 2018-11-12: 4 mg via ORAL
  Filled 2018-11-12 (×2): qty 1

## 2018-11-12 MED ORDER — METOPROLOL TARTRATE 5 MG/5ML IV SOLN: 5.0000 mg | Freq: Four times a day (QID) | INTRAVENOUS | Status: DC | PRN

## 2018-11-12 SURGICAL SUPPLY — 62 items
APPLIER CLIP 5 13 M/L LIGAMAX5 (MISCELLANEOUS)
APPLIER CLIP ROT 10 11.4 M/L (STAPLE)
CABLE HIGH FREQUENCY MONO STRZ (ELECTRODE) ×3 IMPLANT
CHLORAPREP W/TINT 26 (MISCELLANEOUS) ×3 IMPLANT
CLIP APPLIE 5 13 M/L LIGAMAX5 (MISCELLANEOUS) IMPLANT
CLIP APPLIE ROT 10 11.4 M/L (STAPLE) IMPLANT
COVER SURGICAL LIGHT HANDLE (MISCELLANEOUS) ×3 IMPLANT
COVER WAND RF STERILE (DRAPES) IMPLANT
DECANTER SPIKE VIAL GLASS SM (MISCELLANEOUS) ×3 IMPLANT
DRAIN CHANNEL 19F RND (DRAIN) ×3 IMPLANT
DRAIN PENROSE 18X1/2 LTX STRL (DRAIN) IMPLANT
DRAPE WARM FLUID 44X44 (DRAPES) ×3 IMPLANT
DRSG TEGADERM 2-3/8X2-3/4 SM (GAUZE/BANDAGES/DRESSINGS) ×9 IMPLANT
DRSG TEGADERM 4X4.75 (GAUZE/BANDAGES/DRESSINGS) ×3 IMPLANT
ELECT REM PT RETURN 15FT ADLT (MISCELLANEOUS) ×3 IMPLANT
EVACUATOR SILICONE 100CC (DRAIN) ×3 IMPLANT
GAUZE SPONGE 2X2 8PLY STRL LF (GAUZE/BANDAGES/DRESSINGS) ×1 IMPLANT
GLOVE ECLIPSE 8.0 STRL XLNG CF (GLOVE) ×3 IMPLANT
GLOVE INDICATOR 8.0 STRL GRN (GLOVE) ×3 IMPLANT
GOWN STRL REUS W/TWL XL LVL3 (GOWN DISPOSABLE) ×6 IMPLANT
IRRIG SUCT STRYKERFLOW 2 WTIP (MISCELLANEOUS) ×3
IRRIGATION SUCT STRKRFLW 2 WTP (MISCELLANEOUS) ×1 IMPLANT
KIT BASIN OR (CUSTOM PROCEDURE TRAY) ×3 IMPLANT
KIT TURNOVER KIT A (KITS) IMPLANT
LEGGING LITHOTOMY PAIR STRL (DRAPES) ×3 IMPLANT
MARKER SKIN DUAL TIP RULER LAB (MISCELLANEOUS) ×3 IMPLANT
MESH PHASIX RESORB RECT 10X15 (Mesh General) ×3 IMPLANT
PAD POSITIONING PINK XL (MISCELLANEOUS) ×3 IMPLANT
PORT LAP GEL ALEXIS MED 5-9CM (MISCELLANEOUS) ×3 IMPLANT
PROTECTOR NERVE ULNAR (MISCELLANEOUS) IMPLANT
RELOAD STAPLER GREEN 60MM (STAPLE) ×6 IMPLANT
SCISSORS LAP 5X35 DISP (ENDOMECHANICALS) ×6 IMPLANT
SET TUBE SMOKE EVAC HIGH FLOW (TUBING) ×3 IMPLANT
SHEARS HARMONIC ACE PLUS 36CM (ENDOMECHANICALS) ×3 IMPLANT
SLEEVE XCEL OPT CAN 5 100 (ENDOMECHANICALS) ×12 IMPLANT
SPONGE DRAIN TRACH 4X4 STRL 2S (GAUZE/BANDAGES/DRESSINGS) IMPLANT
SPONGE GAUZE 2X2 STER 10/PKG (GAUZE/BANDAGES/DRESSINGS) ×2
STAPLER RELOAD GREEN 60MM (STAPLE) ×18
STAPLER VISISTAT 35W (STAPLE) IMPLANT
SUT ETHIBOND 0 (SUTURE) IMPLANT
SUT ETHIBOND 0 36 GRN (SUTURE) IMPLANT
SUT ETHIBOND NAB CT1 #1 30IN (SUTURE) ×3 IMPLANT
SUT MNCRL AB 4-0 PS2 18 (SUTURE) ×3 IMPLANT
SUT PROLENE 2 0 SH DA (SUTURE) IMPLANT
SUT V-LOC BARB 180 2/0GR6 GS22 (SUTURE) ×3
SUT VIC AB 2-0 SH 27 (SUTURE) ×4
SUT VIC AB 2-0 SH 27X BRD (SUTURE) ×2 IMPLANT
SUT VLOC 180 0 9IN  GS21 (SUTURE) ×2
SUT VLOC 180 0 9IN GS21 (SUTURE) ×1 IMPLANT
SUTURE V-LC BRB 180 2/0GR6GS22 (SUTURE) ×1 IMPLANT
TAPE CLOTH 4X10 WHT NS (GAUZE/BANDAGES/DRESSINGS) IMPLANT
TIP INNERVISION DETACH 40FR (MISCELLANEOUS) IMPLANT
TIP INNERVISION DETACH 50FR (MISCELLANEOUS) IMPLANT
TIP INNERVISION DETACH 56FR (MISCELLANEOUS) IMPLANT
TIPS INNERVISION DETACH 40FR (MISCELLANEOUS)
TOWEL OR 17X26 10 PK STRL BLUE (TOWEL DISPOSABLE) ×6 IMPLANT
TOWEL OR NON WOVEN STRL DISP B (DISPOSABLE) ×3 IMPLANT
TRAY FOLEY MTR SLVR 16FR STAT (SET/KITS/TRAYS/PACK) ×3 IMPLANT
TRAY LAPAROSCOPIC (CUSTOM PROCEDURE TRAY) ×3 IMPLANT
TROCAR BLADELESS OPT 5 100 (ENDOMECHANICALS) ×3 IMPLANT
TROCAR XCEL 12X100 BLDLESS (ENDOMECHANICALS) ×3 IMPLANT
TROCAR XCEL NON-BLD 11X100MML (ENDOMECHANICALS) ×3 IMPLANT

## 2018-11-12 NOTE — ED Notes (Addendum)
Pt informed this nurse that he is addicted to opioids, had not taken any pills in 2 years since he completed his rehab program, however has been taking "za za red" which is a supplement containing Tianeptine, (which is illegal in the [place] and that has opioid effects). Pt says he has been taking this for about a year or so and reports he had really bad withdraws when he stopped opioids and is feeling the same way now that he has not had Tianeptine in over 24 hours.

## 2018-11-12 NOTE — Anesthesia Preprocedure Evaluation (Signed)
Anesthesia Evaluation  Patient identified by MRN, date of birth, ID band Patient awake    Reviewed: Allergy & Precautions, H&P , NPO status , Patient's Chart, lab work & pertinent test results  Airway Mallampati: II   Neck ROM: full    Dental   Pulmonary Current Smoker and Patient abstained from smoking.,    breath sounds clear to auscultation       Cardiovascular negative cardio ROS   Rhythm:regular Rate:Normal     Neuro/Psych    GI/Hepatic hiatal hernia, (+)     substance abuse  alcohol use and marijuana use,   Endo/Other    Renal/GU      Musculoskeletal  (+) narcotic dependent  Abdominal   Peds  Hematology   Anesthesia Other Findings   Reproductive/Obstetrics                             Anesthesia Physical Anesthesia Plan  ASA: II and emergent  Anesthesia Plan: General   Post-op Pain Management:    Induction: Intravenous  PONV Risk Score and Plan: 1 and Ondansetron, Dexamethasone, Midazolam and Treatment may vary due to age or medical condition  Airway Management Planned: Oral ETT  Additional Equipment:   Intra-op Plan:   Post-operative Plan: Extubation in OR  Informed Consent: I have reviewed the patients History and Physical, chart, labs and discussed the procedure including the risks, benefits and alternatives for the proposed anesthesia with the patient or authorized representative who has indicated his/her understanding and acceptance.       Plan Discussed with: CRNA, Anesthesiologist and Surgeon  Anesthesia Plan Comments:         Anesthesia Quick Evaluation

## 2018-11-12 NOTE — Anesthesia Procedure Notes (Signed)
Procedure Name: Intubation Date/Time: 11/12/2018 5:03 PM Performed by: Lavina Hamman, CRNA Pre-anesthesia Checklist: Patient identified, Emergency Drugs available, Suction available, Patient being monitored and Timeout performed Patient Re-evaluated:Patient Re-evaluated prior to induction Oxygen Delivery Method: Circle system utilized Preoxygenation: Pre-oxygenation with 100% oxygen Induction Type: IV induction, Rapid sequence and Cricoid Pressure applied Laryngoscope Size: Mac and 4 Grade View: Grade I Tube type: Oral Tube size: 7.5 mm Number of attempts: 1 Airway Equipment and Method: Stylet Placement Confirmation: ETT inserted through vocal cords under direct vision,  positive ETCO2,  CO2 detector and breath sounds checked- equal and bilateral Secured at: 23 cm Tube secured with: Tape Dental Injury: Teeth and Oropharynx as per pre-operative assessment

## 2018-11-12 NOTE — ED Triage Notes (Signed)
Pt reports chest pain that started around 2300 last night after drinking alcohol. Pt was seen at [facility] ED earlier tonight for "help with alcohol". Pt went home and took "S.O.S. dietary supplement" then vomited and started having chest pain. Pt called EMS. Pt dry heaving in triage.

## 2018-11-12 NOTE — ED Notes (Signed)
Wife updated on plan of care at the request of the patient.

## 2018-11-12 NOTE — ED Notes (Signed)
ED Provider at bedside. 

## 2018-11-12 NOTE — ED Notes (Signed)
Pt up to bathroom- loose stool reported.

## 2018-11-12 NOTE — ED Provider Notes (Signed)
[facility]Y [facility] Provider Note   CSN: [id number] Arrival date & time: 11/12/18  0418     History   Chief Complaint Chief Complaint  Patient presents with  . Chest Pain    HPI Mellody MemosJustin Mroz is a 33 y.o. male.     Level 5 caveat for intoxication.  Patient presents with epigastric pain radiating to his chest and left arm.  States this onset about 3 or 4 hours ago after vomiting 1 time.  Admits to drinking several beers last night and was [facility] for "help with alcohol" and eloped around 1 AM.  He states he went home and took a energy supplement called SOS which was a blue powder to mix with water.  He then vomited one time and had severe pain in his epigastrium radiating to his chest and left arm is been persistent since.  Denies throwing up any coffee grounds or blood.  No blood in the stool.  Pain is in his epigastrium and radiates into his chest and left arm and is constant.  There is nausea but no further vomiting.  No diaphoresis.  Admits to alcohol use but no other drug use.  No history of acid reflux or ulcers. Denies any suicidal thoughts or homicidal thoughts.  Denies hearing any voices.  SOS dietary supplement contains 200 mg caffeine, 40 mg niacin, 5 mg vitamin B6.  Also contains "proprietary focus plan" of Synephrine, acetyl l-carnitine, alpha lipoic acid, L theanine.  Also has been taking supplement called ZaZa Red which contains tianeptine.  The history is provided by the patient and the EMS personnel.  Chest Pain Associated symptoms: abdominal pain, back pain, nausea and vomiting   Associated symptoms: no dizziness, no fever, no headache and no weakness     History reviewed. No pertinent past medical history.  There are no active problems to display for this patient.   History reviewed. No pertinent surgical history.      Home Medications    Prior to Admission medications   Medication Sig Start Date End Date Taking?  Authorizing Provider  ibuprofen (ADVIL,MOTRIN) 600 MG tablet Take 1 tablet (600 mg total) by mouth every 6 (six) hours as needed. Patient not taking: Reported on 11/11/2018 01/27/18   Antony MaduraHumes, Kelly, PA-C  lidocaine (LIDODERM) 5 % Place 1 patch onto the skin daily. Remove & Discard patch within 12 hours or as directed by MD Patient not taking: Reported on 11/11/2018 01/27/18   Antony MaduraHumes, Kelly, PA-C  methocarbamol (ROBAXIN) 500 MG tablet Take 1 tablet (500 mg total) by mouth every 8 (eight) hours as needed for muscle spasms. Patient not taking: Reported on 11/11/2018 01/27/18   Antony MaduraHumes, Kelly, PA-C  tobramycin (TOBREX) 0.3 % ophthalmic solution Place 2 drops into the left eye every 4 (four) hours. Patient not taking: Reported on 01/27/2018 12/29/17   Tommi RumpsSummers, Rhonda L, PA-C    Family History No family history on file.  Social History Social History   Tobacco Use  . Smoking status: Current Every Day Smoker    Packs/day: 1.50    Types: Cigarettes  . Smokeless tobacco: Never Used  Substance Use Topics  . Alcohol use: Yes  . Drug use: Not Currently     Allergies   Patient has no known allergies.   Review of Systems Review of Systems  Constitutional: Negative for activity change, appetite change and fever.  HENT: Negative for congestion and rhinorrhea.   Respiratory: Positive for chest tightness.   Cardiovascular:  Positive for chest pain.  Gastrointestinal: Positive for abdominal pain, nausea and vomiting.  Genitourinary: Negative for dysuria and hematuria.  Musculoskeletal: Positive for back pain. Negative for arthralgias and myalgias.  Skin: Negative for rash.  Neurological: Negative for dizziness, weakness and headaches.   all other systems are negative except as noted in the HPI and PMH.     Physical Exam Updated Vital Signs BP (!) 146/96 (BP Location: Right Arm)   Pulse 71   Temp (!) 97.5 F (36.4 C) (Axillary) Comment: pt throwing up  Resp (!) 24   Ht 6\' 1"  (1.854 m)   Wt 77.1  kg   SpO2 95%   BMI 22.43 kg/m   Physical Exam Vitals signs and nursing note reviewed.  Constitutional:      General: He is not in acute distress.    Appearance: He is well-developed.     Comments: uncomfortable  HENT:     Head: Normocephalic and atraumatic.     Mouth/Throat:     Pharynx: No oropharyngeal exudate.  Eyes:     Conjunctiva/sclera: Conjunctivae normal.     Pupils: Pupils are equal, round, and reactive to light.  Neck:     Musculoskeletal: Normal range of motion and neck supple.     Comments: No meningismus. Cardiovascular:     Rate and Rhythm: Normal rate and regular rhythm.     Heart sounds: Normal heart sounds. No murmur.  Pulmonary:     Effort: Pulmonary effort is normal. No respiratory distress.     Breath sounds: Normal breath sounds.  Chest:     Chest wall: Tenderness present.  Abdominal:     Palpations: Abdomen is soft.     Tenderness: There is abdominal tenderness. There is guarding. There is no rebound.     Comments: Epigastric tenderness with voluntary guarding.  Abdomen soft.  Musculoskeletal: Normal range of motion.        General: No tenderness.  Skin:    General: Skin is warm.     Capillary Refill: Capillary refill takes less than 2 seconds.  Neurological:     General: No focal deficit present.     Mental Status: He is alert and oriented to person, place, and time. Mental status is at baseline.     Cranial Nerves: No cranial nerve deficit.     Motor: No abnormal muscle tone.     Coordination: Coordination normal.     Comments: No ataxia on finger to nose bilaterally. No pronator drift. 5/5 strength throughout. CN 2-12 intact.Equal grip strength. Sensation intact.   Psychiatric:        Behavior: Behavior normal.      ED Treatments / Results  Labs (all labs ordered are listed, but only abnormal results are displayed) Labs Reviewed  CBC WITH DIFFERENTIAL/PLATELET - Abnormal; Notable for the following components:      Result Value   MCV  101.3 (*)    MCH 35.4 (*)    All other components within normal limits  COMPREHENSIVE METABOLIC PANEL - Abnormal; Notable for the following components:   Potassium 3.3 (*)    Glucose, Bld 154 (*)    BUN <5 (*)    AST 44 (*)    All other components within normal limits  ETHANOL - Abnormal; Notable for the following components:   Alcohol, Ethyl (B) 189 (*)    All other components within normal limits  URINALYSIS, ROUTINE W REFLEX MICROSCOPIC - Abnormal; Notable for the following components:   APPearance HAZY (*)  Glucose, UA 50 (*)    Protein, ur 100 (*)    Bacteria, UA FEW (*)    All other components within normal limits  ACETAMINOPHEN LEVEL - Abnormal; Notable for the following components:   Acetaminophen (Tylenol), Serum <10 (*)    All other components within normal limits  LIPASE, BLOOD  SALICYLATE LEVEL  RAPID URINE DRUG SCREEN, HOSP PERFORMED  TROPONIN I (HIGH SENSITIVITY)  TROPONIN I (HIGH SENSITIVITY)    EKG EKG Interpretation  Date/Time:  Sunday November 12 2018 04:33:46 EDT Ventricular Rate:  82 PR Interval:    QRS Duration: 99 QT Interval:  375 QTC Calculation: 438 R Axis:   101 Text Interpretation:  Sinus rhythm Right axis deviation ST elev, probable normal early repol pattern No significant change was found Confirmed by Glynn Octaveancour, Karlis Cregg [phone]) on 11/12/2018 4:42:15 AM   Radiology Dg Abdomen Acute W/chest  Result Date: 11/12/2018 CLINICAL DATA:  Acute onset chest pain and vomiting last night. EXAM: DG ABDOMEN ACUTE W/ 1V CHEST COMPARISON:  None. FINDINGS: There is no evidence of dilated bowel loops or free intraperitoneal air. No radiopaque calculi or other significant radiographic abnormality is seen. Heart size and mediastinal contours are within normal limits. Both lungs are clear. IMPRESSION: Negative abdominal radiographs.  No active cardiopulmonary disease. Electronically Signed   By: Danae OrleansJohn A Stahl M.D.   On: 11/12/2018 07:15    Procedures Procedures  (including critical care time)  Medications Ordered in ED Medications  sodium chloride 0.9 % bolus 1,000 mL (has no administration in time range)  famotidine (PEPCID) IVPB 20 mg premix (has no administration in time range)  pantoprazole (PROTONIX) injection 40 mg (has no administration in time range)  alum & mag hydroxide-simeth (MAALOX/MYLANTA) 200-200-20 MG/5ML suspension 30 mL (has no administration in time range)    And  lidocaine (XYLOCAINE) 2 % viscous mouth solution 15 mL (has no administration in time range)     Initial Impression / Assessment and Plan / ED Course  I have reviewed the triage vital signs and the nursing notes.  Pertinent labs & imaging results that were available during my care of the patient were reviewed by me and considered in my medical decision making (see chart for details).       Epigastric pain rating to the chest after vomiting at home after using energy supplements.  Patient does not know what emesis looked like. States he has been having ongoing burning chest pain and epigastric pain for the past 3 or 4 hours.  Discussed with [facility].  Supplements from SOS are expected to cause some nausea vomiting abdominal pain.  They recommend supportive care monitoring for 4 hours after ingestion given the caffeine intake. Half-life of Tianeptine is 2-1/2 hours and symptoms also include abdominal pain and vomiting. This is expected be out of his system as patient last took this 24 hours ago.  We will treat symptoms and give IV hydration.  PPI, Pepcid, GI cocktail  Patient may also have component of opiate withdrawal given his regular use of synthetic opioids found in Zaza Red.  Labs with normal LFTs and lipase. Troponin negative.  AAS negative.  Still with significant epigastric tenderness, will proceed with CT imaging to r/o PUD and/or esophageal perforation. Second troponin pending.  Suspect some element of gastritis and esophagitis.  May  have some component of alcohol withdrawal as well.  CT scan pending at shift change as well as second troponin.  Anticipate discharge home with PPI and symptomatic control  if results reassuring. Patient and wife state he will be going to [employer] today.  Care transferred to Dr. Rogene Houston at shift change.   Final Clinical Impressions(s) / ED Diagnoses   Final diagnoses:  Epigastric pain  Alcohol abuse    ED Discharge Orders    None       Geriann Lafont, Annie Main, MD 11/12/18 [phone]

## 2018-11-12 NOTE — Op Note (Signed)
11/12/2018  8:47 PM  PATIENT:  Roberto Flores  33 y.o. male  Patient Care Team: Patient, No Pcp Per as PCP - General (General Practice)  PRE-OPERATIVE DIAGNOSIS:  Strangulated hiatal hernia  POST-OPERATIVE DIAGNOSIS: STRANGULATED DIAPHRAGMATIC HERNIA CONTAINING NECROTIC STOMACH HIATAL HERNIA FATTY STEATOHEPATITIS ACCESSORY SPLEEN  PROCEDURE:   LAPAROSCOPIC REDUCTION OF STRANGULATED DIAPHRAGMATIC HERNIA PARTIAL GASTRECTOMY EGD PRIMARY HIATAL HERNIA REPAIR  SURGEON:  Ardeth Sportsman, MD  ASSIST:  OR Staff  ANESTHESIA:   local and general  EBL:  Total I/O In: 1000 [I.V.:1000] Out: 130 [Urine:30; Blood:100]  Delay start of Pharmacological VTE agent (>24hrs) due to surgical blood loss or risk of bleeding:  no  ANESTHESIA: 1. General anesthesia. 2. Local anesthetic in a field block around all port sites.  SPECIMEN:  PROXIMAL STOMACH  DRAINS:  A 19-French Blake drain goes from the right upper quadrant along the lesser curvature of the stomach into the LEFT pleural cavity.  COUNTS:  YES  PLAN OF CARE: Admit to inpatient   PATIENT DISPOSITION:  PACU - guarded condition.  INDICATION:   Patient has history of polysubstance abuse and alcohol binge drinking with nausea vomiting and pain after having sixpack of beer yesterday.  Pain became more intense.  Went to emergency room.  Hydrated.  Felt worsening pain went to different emergency room.  CT scan showing mass in left chest consistent with stomach herniation through the diaphragmatic hernia.  Transferred to [facility] for urgent surgical evaluation.  Patient with frank peritonitis and signs of early shock.  I recommended emergent operative exploration.  Laparoscopic possible open approach.  Probable partial stomach resection.  The anatomy & physiology of the foregut and anti-reflux mechanism was discussed.  The pathophysiology of hiatal herniation and GERD was discussed.  Natural history risks without surgery was discussed.    The patient's symptoms are not adequately controlled by medicines and other non-operative treatments.  I feel the risks of no intervention will lead to serious problems that outweigh the operative risks; therefore, I recommended surgery to reduce the hiatal hernia out of the chest and possible fundoplication to rebuild the anti-reflux valve and control reflux better.  Need for a thorough workup to rule out the differential diagnosis and plan treatment was explained.  I explained laparoscopic techniques with possible need for an open approach.  Risks such as bleeding, infection, abscess, leak, need for further treatment, reoperation.  Heart attack, death, and other risks were discussed.   I noted a good likelihood this will help address the problem.  Goals of post-operative recovery were discussed as well.  Possibility that this will not correct all symptoms was explained.  Post-operative dysphagia, need for short-term liquid & pureed diet, inability to vomit, possibility of reherniation, possible need for medicines to help control symptoms in addition to surgery were discussed.  We will work to minimize complications.   Educational handouts further explaining the pathology, treatment options, and dysphagia diet was given as well.  Questions were answered.  The patient expresses understanding & wishes to proceed with surgery.  OR FINDINGS:   Left anterior diaphragmatic hernia 6 x 4 cm with all the fundus and proximal stomach body incarcerated into the left chest.  Ischemia of the whole region with frank necrosis of the anterior wall.  Proximal gastrectomy sleeve type done.  EGD with no leak.  Diaphragmatic hernia primarily repaired with a resolvable V lock suture.  Reinforced with Phasix absorbable mesh.  Mesh was used: PhasixT Mesh (a knitted monofilament mesh scaffold using  Poly-4-hydroxybutyrate (P4HB), a biologically derived, fully resorbable material).  I did not want to use permanent mesh in the setting  of necrotic stomach.  Did not use any pledgets or Ethibond sutures in the setting of necrotic stomach.  Small but definite sliding hiatal hernia anteriorly.  Primarily repaired.  Held off on fundoplication since there was no fundus remaining.  Enlarged liver consistent with steatohepatitis.  No frank cirrhosis though.  4 cm spherical mass consistent with accessory spleen along proximal greater curvature of stomach  DESCRIPTION:   Informed consent was confirmed.  The patient received IV antibiotics prior to incision.  The underwent general anesthesia without difficulty.  A Foley catheter sterilely placed.  The patient was positioned in split leg with arms tucked. The abdomen was prepped and draped in the sterile fashion.  Nasogastric tube is been placed by anesthesia to suction.  Surgical time-out confirmed our plan.  I placed a 5 mm port in the left upper quadrant paramedian region using optical entry technique with the patient in steep reverse Trendelenburg and left side up.  Entry was clean.  We induced carbon dioxide insufflation.  Camera inspection revealed no injury.  Under direct visualization, I placed 5 mm port in the right mid abdomen and a 10 mm port on the anterior axillary line on the left subcostal ridge.  I also placed a 5 mm port in the left subxiphoid region under direct visualization.  I removed that and placed an Omega-shaped rigid Nathanson liver retractor to lift the left lateral sector of the liver anteriorly to expose the esophageal hiatus.  This was secured to the bed using the iron man system.  I placed another 5mm port in the subxiphoid region.  Attention showed the greater curvature of the stomach was densely adherent to the diaphragm.  This seen lateral to the esophageal hiatus.  Became quickly apparent that stomach was incarcerated through a diaphragmatic defect left anterior laterally.  I worked to free short gastric attachments as well as some greater omentum to help try  and reduce some greater omentum out of the chest.  Encountered a small but definite accessory spleen in the proximal short gastrics.  Left that in place with the spleen in the left upper quadrant.  Worked to preserve the gastroepiploic vessels.  The most left lateral tip of the left lateral sector of liver was going up into the diaphragmatic hernia as well anteriorly.  I mobilized the left lateral sector of the liver off the diaphragm in a left to right fashion going towards the midline.  Was able to reduce the corner of liver out of the hernia.  I was able to get between the stomach and the diaphragmatic defect.  I tried to see if I could come through the diaphragm and reduce the type of hernia sac but there really was not one.  Because the stomach was incarcerated, I used harmonic scalpel to transect through the diaphragm to help try and release the hernia since it would not reducd.   Did this for another 3 cm.  Quickly came apparent that the stomach in the chest was ischemic and suspicious for necrosis.  Still somewhat stuck and would not easily reduce.  Eventually I was able to reduce some greater omentum out of the diaphragmatic defect at the the left lateral corner after transecting off the greater curvature and ligating the short gastrics.  Eventually found the posterior gastric wall was more viable.  I ended up coming through the hepatogastric ligament along  the lesser curvature stomach and following it to identify the right anterior crus.  Came at the apex of the crura to confirm a sliding hiatal hernia but an intact uninflamed esophagus.  Identified the left crus and gradually freed off that.  Without I confirmed that he had 2 separate diaphragmatic hernias.  The smaller anterior sliding hiatal hernia and the much larger diaphragmatic hernia anterior and left lateral to the esophageal hiatus.  Gradually I could identify the posterior gastric wall.  It became apparent that the fundus and the middle  greater curvature of the stomach/body was going up through this diaphragmatic defect.  Working anteriorly I quickly encountered frankly necrotic stomach.  I decided to try and hold off on doing more aggressive reduction for fear of perforating the stomach.  Therefore I worked and came along the left lateral diaphragm towards the left crus and eventually the base of the crura more towards the retroperitoneum to better identify the stomach.  The tail the pancreas was coming close to coming up in there and I carefully freed that off as well as some posterior gastric wall attachments to the retroperitoneum.  Could see the tip of the nasogastric tube in the proximal stomach.  Had anesthesia pulled that back until it was in the esophagus. Confirmed and left intact the lesser curvature as well as the proximal gastric artery.  I worked to try and reduce the posterior gastric wall that was herniated up into the chest since that looked more viable.  Gradually working posteriorly, left lateral, anteriorly and even anteromedially; I was able reduce the stomach out of the left chest..  It was purple and black consistent with frank necrosis.  Eventually got a good release.  The fundus and proximal greater curvature the stomach was stretched out and quite enlarged and purple/black.  Upper half of the stomach looked like that.  Lesser curve seemed spared.  Posterior wall was ischemic but not frankly necrotic.  All the proximal anterior gastric wall was frankly necrotic.    I did inspection of left chest cavity through the diaphragmatic defect to see a collapsed left lung.  Purple with black specks consistent with his smoking history.  I do not see any obvious perforated gastric contents.  Moderate size bloody pleural effusion.  Irrigated and aspirated.  Hemostasis was good.  I decided to transect the nonviable stomach.  I did that using a 60 mm Echelon laparoscopic stapler.  I used green loads.  Transected at the mid greater  curvature and came towards the lesser curvature.  I then angulated cephalad and did a proximal sleeve gastrectomy aiming for the angle of His.  Worked to leave 4-5 centimeter of viable lesser curvature.  I had avoided dissecting along the lesser curvature much nor in the posterior hiatus to minimize any compromise of blood supply.  Sent the transected stomach down into the lower abdomen.  I did copious irrigation.  I inspected the staple line.  There are few areas along the staple line that had bleeding.  I controlled this with 2-0 Vicryl laparoscopic intracorporeal suturing x3.  I also dunked the most inferolateral corner of the staple line at the proximal end of the remaining greater curvature with a figure-of-eight 2-0 Vicryl suture to help imbricate that corner.  The more proximal staple line did not have any sharp corners as I had gently curved inward towards the angle of Hiss.  Irrigated the upper abdomen had the patient in more Trendelenburg positioning to have a pool  of irrigation in the upper quadrant.  I had the OR staff pushed down and clamp the mid duodenum.  I did an EGD.  I was able to pass the EGD transorally into a dilated but viable esophagus.  Passed across the lower esophageal sphincter around 42 cm.  Proximal stomach was narrowed with some old blood but viable.  We did insufflation with some intraperitoneal irrigation.  There was a negative air leak test.  The inner mucosa.  No active bleeding.  There is no necrosis or areas of gangrene.  The stomach that remained was viable.  Certainly while the proximal stomach had narrowed, it was not severe.  Aspirated back the stomach.  Had nasogastric tube passed from the esophagus and could see the tip going down into the antrum by laparoscopic visualization.  Taped.  I re-scrubbed back in and came back to the abdomen.  I did copious irrigation.  Stomach looked viable.  I then focused on repair of the paraesophageal hiatal sliding in separate left  anterior diaphragmatic hernias.  I used a 2-0 V lock in a running horizontal mattress fashion to close the anterior hiatal hernia primarily x4 cm.  That brought the crura together.  I could still pass a regular laparoscopic grasper, or against any strangulation.  Next I focused on the diaphragmatic hernia repair.  I placed a drain through the left upper quadrant staple port site and brought out a right upper quadrant 5 mm port site.  Secured skin with 2-0 Prolene.  Placed that into the diaphragmatic hernia into the left chest.  I then primarily closed the defect with 0V lock in a running fashion working around the drain and snugged that down.  I then laid Phasix mesh 10 x 15 cm and laid it over the diaphragmatic repair transversely going from the crura at the midline to left lateral anterior diaphragm with plenty of overlap.  I used the 2-0 V lock from the hiatal hernia closure and use that to help tack the and secure the medial side of the mesh in a posterior to anterior fashion.  I then used 2 OV lock and secured to the most deep/posterior part of alongside of the mesh to the left posterior diaphragm.  Ran to the corner and then ran it up the right lateral side as well.  Did one more V lock and ran to help close the long anterior side and then come down and re-and force it by closing it over the closure as well.  I took out the [facility] liver retractor under direct utilization and allow the left lateral sector of liver to lay over the stomach.  I ran a 2-0 Vicryl suture on the medial midline area up the left lateral midline side in the midline/anterior corner and then ran that towards the long anterior side of the mesh.  The mesh laid well with plenty of overlap.  I did copious irrigation with good return.  I opened up the left subcostal stapler port site to 4 cm and placed a wound protector and eventually was able to get the stomach out.  No peritoneum was evacuated.  Ports removed.  Drain placed on suction  and anesthesia did positive pressure in the lungs to help reduce deflate the left lung.  Drain held suction.  Fascia closed the left subcostal region with #1 running PDS.  Skin closed with 4 Monocryl.  Sterile dressings applied.  Patient is extubated and is in recovery room in stable condition.  He has  required a moderate amount of Ativan for probable early alcohol withdrawal.  We watch him in the stepdown unit overnight.  Keep the NGT tube in place until ileus resolves.  Then most likely clamping trial.  IV antibiotics for 4 more days given the frank necrosis of the stomach in the chest.  Switch to Zosyn since more serious infection.  I discussed operative findings, updated the patient's status, discussed probable steps to recovery, and gave postoperative recommendations to the patient's spouse., Roberto Flores  Recommendations were made.  Questions were answered.  She expressed understanding & appreciation.   Ardeth Sportsman, M.D., F.A.C.S. Gastrointestinal and Minimally Invasive Surgery [facility] [address] [place], [place] [address] [phone] Main / Paging

## 2018-11-12 NOTE — Progress Notes (Signed)
Dr Mertha Baars notified pt unresponsive to stimuli, , respirations even.  Advised to discontinue precedex infusion, MD expects pt to be sedated for a few hours post-op

## 2018-11-12 NOTE — H&P (Signed)
Roberto Flores  [birth year] [id number]  CARE TEAM:  PCP: Patient, No Pcp Per  Outpatient Care Team: Patient Care Team: Patient, No Pcp Per as PCP - General (General Practice)  Inpatient Treatment Team: Treatment Team: Attending Provider: Dorie Rank, MD; Physician Assistant: Robinson, [place] N, PA-C   This patient is a 33 y.o.male who presents today for surgical evaluation at the request of Dr Roberto Flores.   Chief complaint / Reason for evaluation: Nausea vomiting with hiatal hernias.  Suspicion for incarceration/strangulation.  Young male with polysubstance abuse including tobacco, marijuana, alcohol, Molly, fentanyl.  Worsening nausea vomiting abdominal pain.  Went to [facility].  Suspect gastritis versus related to an alcohol binge episode.  Rehydrated and went home.  However he had worsening nausea and vomiting.  Decided to go [facility].  Pain seem more intense.  Work-up negative for any myocardial infarction.  CT scan done.  Shows a knuckle of sidewall of stomach going up into the left chest.  Concerning for paraesophageal versus diaphragmatic hernia.  Poor perfusion concerning for ischemia.  Given significant pain.  Surgical consultation requested.  Evaluated by Dr. Arnoldo Flores up there.  Patient hemodynamically stable but outside his comfort level of management.  Transfer requested [place].  My partner Dr Roberto Flores requested I help evaluate given my expertise in foregut and minimally base of surgery.  Therefore sent to [facility]  Patient notes that he had nausea and vomiting after drinking a sixpack of beer last night.  It persisted and concerned him.  Sharp upper abdominal left-sided chest wall pain.  He denies really any history of heartburn or reflux.  No history of heavy nonsteroidal use or ulcers.  He does have a history of polysubstance abuse he denies any use of anything beyond alcohol and marijuana in the past few days.  He is never had any abdominal surgery.  He works  outdoors and is very physically active.  No history of exertional chest pain.  No family history of early cardiac disease that he is aware of.  No personal nor family history of GI/colon cancer, inflammatory bowel disease, irritable bowel syndrome, allergy such as Celiac Sprue, dietary/dairy problems, colitis, ulcers nor gastritis.  No recent sick contacts/gastroenteritis.  No travel outside the country.  No changes in diet.  No dysphagia to solids or liquids.  No significant heartburn or reflux.  No hematochezia, hematemesis, coffee ground emesis.  No evidence of prior gastric/peptic ulceration.   No exertional chest/neck/shoulder/arm pain.      Assessment  Roberto Flores  33 y.o. male  Day of Surgery  Procedure(s): LAPAROSCOPIC  hiatel hernia repair partial gastrectomy  Problem List:  Principal Problem:   Strangulated hiatal hernia Active Problems:   Alcohol abuse   Episodic cannabis use   Nausea and vomiting   Polysubstance abuse ([place])   History of methylenedioxymethamphetamine (MDMA) "Molly" use   Peritonitis with evidence of fluid-filled mass near diaphragm suspicious for paraesophageal or peri-diaphragmatic Richter's type hernia of stomach.  Plan:  IV fluids.  IV antibiotics.  Emergent laparoscopic possible open exploration.  Suspect this will be a reduction with gastrectomy wedge of the perforated segment.  If truly paraesophageal may require fundoplication as well.  With the absence of any diaphragmatic rupture on his CAT scan last year, it seems odd to suddenly happen with just retching.  That would make Mallory-Weiss tear more likely but there is no evidence of free air in the fluid collection is not periesophageal.  Patient obviously miserable and  agrees to surgery.  Will do emergently  The anatomy & physiology of the digestive tract was discussed.  The pathophysiology of perforation was discussed.  Differential diagnosis such as perforated ulcer or colon, etc was discussed.     Natural history risks without surgery such as death was discussed.  I recommended abdominal exploration to diagnose & treat the source of the problem.  Most likely partial gastrectomy and possible fundoplication and probable diaphragmatic hernia repair as well laparoscopic & open techniques were discussed.   Risks such as bleeding, infection, abscess, leak, reoperation, bowel resection, possible ostomy, injury to other organs, need for repair of tissues / organs, hernia, heart attack, death, and other risks were discussed.   The risks of no intervention will lead to serious problems including death.   I expressed a good likelihood that surgery will address the problem.    Goals of post-operative recovery were discussed as well.  We will work to minimize complications although risks in an emergent setting are high.   Discussion made with the presence of the emergency room his nurse as well questions were answered.  The patient expressed understanding & wishes to proceed with surgery.       -CIWA alcohol withdrawal protocol given history of heavy alcohol use in the past. -IV fluids -PPI IV -VTE prophylaxis- SCDs, etc -mobilize as tolerated to help recovery  35 minutes spent in review, evaluation, examination, counseling, and coordination of care.  More than 50% of that time was spent in counseling.  Roberto Sportsman, MD, FACS, MASCRS Gastrointestinal and Minimally Invasive Surgery    [address]. [address] [place], [place] [address] [phone] Main / Paging [phone] Fax   11/12/2018      Past Medical History:  Diagnosis Date   Alcohol dependence (HCC)    Opioid dependence (HCC) 10/2018   claims he went through rehab for Fentanyl and no longer uses    History reviewed. No pertinent surgical history.  Social History   Socioeconomic History   Marital status: Married    Spouse name: Not on file   Number of children: Not on file   Years of education: Not  on file   Highest education level: Not on file  Occupational History   Not on file  Social Needs   Financial resource strain: Not on file   Food insecurity    Worry: Not on file    Inability: Not on file   Transportation needs    Medical: Not on file    Non-medical: Not on file  Tobacco Use   Smoking status: Current Every Day Smoker    Packs/day: 1.50    Types: Cigarettes   Smokeless tobacco: Never Used  Substance and Sexual Activity   Alcohol use: Yes   Drug use: Not Currently   Sexual activity: Yes  Lifestyle   Physical activity    Days per week: Not on file    Minutes per session: Not on file   Stress: Not on file  Relationships   Social connections    Talks on phone: Not on file    Gets together: Not on file    Attends religious service: Not on file    Active member of club or organization: Not on file    Attends meetings of clubs or organizations: Not on file    Relationship status: Not on file   Intimate partner violence    Fear of current or ex partner: Not on file  Emotionally abused: Not on file    Physically abused: Not on file    Forced sexual activity: Not on file  Other Topics Concern   Not on file  Social History Narrative   Not on file    History reviewed. No pertinent family history.  Current Facility-Administered Medications  Medication Dose Route Frequency Provider Last Rate Last Dose   dicyclomine (BENTYL) capsule 20 mg  20 mg Oral Q6H PRN Vanetta MuldersZackowski, Scott, MD   20 mg at 11/12/18 1035   hydrOXYzine (ATARAX/VISTARIL) tablet 25 mg  25 mg Oral Q6H PRN Rancour, Jeannett SeniorStephen, MD   25 mg at 11/12/18 [id number]   loperamide (IMODIUM) capsule 2-4 mg  2-4 mg Oral PRN Rancour, Jeannett SeniorStephen, MD   2 mg at 11/12/18 [id number]   ondansetron (ZOFRAN-ODT) disintegrating tablet 4 mg  4 mg Oral Q6H PRN Rancour, Jeannett SeniorStephen, MD   4 mg at 11/12/18 [id number]   Current Outpatient Medications  Medication Sig Dispense Refill   dicyclomine (BENTYL) 20 MG tablet Take 1 tablet  (20 mg total) by mouth 2 (two) times daily. 20 tablet 0   ibuprofen (ADVIL,MOTRIN) 600 MG tablet Take 1 tablet (600 mg total) by mouth every 6 (six) hours as needed. (Patient not taking: Reported on 11/11/2018) 30 tablet 0   lidocaine (LIDODERM) 5 % Place 1 patch onto the skin daily. Remove & Discard patch within 12 hours or as directed by MD (Patient not taking: Reported on 11/11/2018) 15 patch 0   methocarbamol (ROBAXIN) 500 MG tablet Take 1 tablet (500 mg total) by mouth every 8 (eight) hours as needed for muscle spasms. (Patient not taking: Reported on 11/11/2018) 20 tablet 0   omeprazole (PRILOSEC) 20 MG capsule Take 1 capsule (20 mg total) by mouth daily. 30 capsule 0   ondansetron (ZOFRAN ODT) 4 MG disintegrating tablet Take 1 tablet (4 mg total) by mouth every 8 (eight) hours as needed for nausea or vomiting. 20 tablet 0   tobramycin (TOBREX) 0.3 % ophthalmic solution Place 2 drops into the left eye every 4 (four) hours. (Patient not taking: Reported on 01/27/2018) 5 mL 0     No Known Allergies  ROS:   All other systems reviewed & are negative except per HPI or as noted below: Constitutional:  No fevers, chills, sweats.  Weight stable Eyes:  No vision changes, No discharge HENT:  No sore throats, nasal drainage Lymph: No neck swelling, No bruising easily Pulmonary:  No cough, productive sputum CV: No orthopnea, PND  Patient walks 60 minutes for about 2 miles without difficulty.  No exertional chest/neck/shoulder/arm pain. GI: No personal nor family history of GI/colon cancer, inflammatory bowel disease, irritable bowel syndrome, allergy such as Celiac Sprue, dietary/dairy problems, colitis, ulcers nor gastritis.  No recent sick contacts/gastroenteritis.  No travel outside the country.  No changes in diet. Renal: No UTIs, No hematuria Genital:  No drainage, bleeding, masses Musculoskeletal: No severe joint pain.  Good ROM major joints Skin:  No sores or lesions.  No rashes Heme/Lymph:   No easy bleeding.  No swollen lymph nodes Neuro: No focal weakness/numbness.  No seizures Psych: No suicidal ideation.  No hallucinations  BP (!) 149/96    Pulse 69    Temp (!) 97.5 F (36.4 C) (Axillary) Comment: pt throwing up   Resp (!) 25    Ht 6\' 1"  (1.854 m)    Wt 77.1 kg    SpO2 100%    BMI 22.43 kg/m   Physical Exam: General: Pt awake/alert/oriented  x4 in moderate major acute distress Eyes: PERRL, normal EOM. Sclera nonicteric Neuro: CN II-XII intact w/o focal sensory/motor deficits. Lymph: No head/neck/groin lymphadenopathy Psych:  No delerium/psychosis/paranoia HENT: Normocephalic, Mucus membranes moist.  No thrush Neck: Supple, No tracheal deviation Chest: No pain.  Good respiratory excursion. CV:  Pulses intact.  Regular rhythm  Abdomen: Flat nondistended but rigid hard with involuntary guarding.  Diffusely tender especially in epigastric/left upper quadrant region.  Peritonitis.  No diastases or umbilical hernias.  No prior incisions   Gen:  No inguinal hernias.  No inguinal lymphadenopathy.   Ext:  SCDs BLE.  No significant edema.  No cyanosis Skin: No petechiae / purpurea.  No major sores Musculoskeletal: No severe joint pain.  Good ROM major joints   Results:   Labs: Results for orders placed or performed during the hospital encounter of 11/12/18 (from the past 48 hour(s))  CBC with Differential/Platelet     Status: Abnormal   Collection Time: 11/12/18  5:15 AM  Result Value Ref Range   WBC 8.0 4.0 - 10.5 K/uL   RBC 4.49 4.22 - 5.81 MIL/uL   Hemoglobin 15.9 13.0 - 17.0 g/dL   HCT 65.7 84.6 - 96.2 %   MCV 101.3 (H) 80.0 - 100.0 fL   MCH 35.4 (H) 26.0 - 34.0 pg   MCHC 34.9 30.0 - 36.0 g/dL   RDW 95.2 84.1 - 32.4 %   Platelets 278 150 - 400 K/uL   nRBC 0.0 0.0 - 0.2 %   Neutrophils Relative % 83 %   Neutro Abs 6.6 1.7 - 7.7 K/uL   Lymphocytes Relative 12 %   Lymphs Abs 1.0 0.7 - 4.0 K/uL   Monocytes Relative 5 %   Monocytes Absolute 0.4 0.1 - 1.0 K/uL    Eosinophils Relative 0 %   Eosinophils Absolute 0.0 0.0 - 0.5 K/uL   Basophils Relative 0 %   Basophils Absolute 0.0 0.0 - 0.1 K/uL   Immature Granulocytes 0 %   Abs Immature Granulocytes 0.03 0.00 - 0.07 K/uL    Comment: Performed at [facility], [address] [place], [place] [address]  Comprehensive metabolic panel     Status: Abnormal   Collection Time: 11/12/18  5:15 AM  Result Value Ref Range   Sodium 142 135 - 145 mmol/L   Potassium 3.3 (L) 3.5 - 5.1 mmol/L   Chloride 105 98 - 111 mmol/L   CO2 23 22 - 32 mmol/L   Glucose, Bld 154 (H) 70 - 99 mg/dL   BUN <5 (L) 6 - 20 mg/dL   Creatinine, Ser 7.25 0.61 - 1.24 mg/dL   Calcium 9.1 8.9 - 36.6 mg/dL   Total Protein 8.0 6.5 - 8.1 g/dL   Albumin 5.0 3.5 - 5.0 g/dL   AST 44 (H) 15 - 41 U/L   ALT 41 0 - 44 U/L   Alkaline Phosphatase 94 38 - 126 U/L   Total Bilirubin 0.6 0.3 - 1.2 mg/dL   GFR calc non Af Amer >60 >60 mL/min   GFR calc Af Amer >60 >60 mL/min   Anion gap 14 5 - 15    Comment: Performed at [facility], [address], [place], [place] [address]  Lipase, blood     Status: None   Collection Time: 11/12/18  5:15 AM  Result Value Ref Range   Lipase 23 11 - 51 U/L    Comment: Performed at [facility], [address] [place], [place] [address]  Troponin I (High  Sensitivity)     Status: None   Collection Time: 11/12/18  5:15 AM  Result Value Ref Range   Troponin I (High Sensitivity) 2 <18 ng/L    Comment: (NOTE) Elevated high sensitivity troponin I (hsTnI) values and significant  changes across serial measurements may suggest ACS but many other  chronic and acute conditions are known to elevate hsTnI results.  Refer to the Links section for chest pain algorithms and additional  guidance. Performed at [facility], [address] [place], [place] [address]   Ethanol     Status: Abnormal   Collection Time: 11/12/18  5:15 AM  Result Value Ref Range   Alcohol, Ethyl (B) 189 (H) <10 mg/dL    Comment: (NOTE) Lowest  detectable limit for serum alcohol is 10 mg/dL. For medical purposes only. Performed at [facility], [address] [place], [place] [address]   Acetaminophen level     Status: Abnormal   Collection Time: 11/12/18  5:15 AM  Result Value Ref Range   Acetaminophen (Tylenol), Serum <10 (L) 10 - 30 ug/mL    Comment: (NOTE) Therapeutic concentrations vary significantly. A range of 10-30 ug/mL  may be an effective concentration for many patients. However, some  are best treated at concentrations outside of this range. Acetaminophen concentrations >150 ug/mL at 4 hours after ingestion  and >50 ug/mL at 12 hours after ingestion are often associated with  toxic reactions. Performed at [facility], [address] [place], [place] [address]   Salicylate level     Status: None   Collection Time: 11/12/18  5:15 AM  Result Value Ref Range   Salicylate Lvl <7.0 2.8 - 30.0 mg/dL    Comment: Performed at [facility], [address] [place], [place] [address]  Rapid urine drug screen (hospital performed)     Status: Abnormal   Collection Time: 11/12/18  6:20 AM  Result Value Ref Range   Opiates NONE DETECTED NONE DETECTED   Cocaine NONE DETECTED NONE DETECTED   Benzodiazepines NONE DETECTED NONE DETECTED   Amphetamines NONE DETECTED NONE DETECTED   Tetrahydrocannabinol POSITIVE (A) NONE DETECTED   Barbiturates NONE DETECTED NONE DETECTED    Comment: (NOTE) DRUG SCREEN FOR MEDICAL PURPOSES ONLY.  IF CONFIRMATION IS NEEDED FOR ANY PURPOSE, NOTIFY LAB WITHIN 5 DAYS. LOWEST DETECTABLE LIMITS FOR URINE DRUG SCREEN Drug Class                     Cutoff (ng/mL) Amphetamine and metabolites    1000 Barbiturate and metabolites    200 Benzodiazepine                 200 Tricyclics and metabolites     300 Opiates and metabolites        300 Cocaine and metabolites        300 THC                            50 Performed at [facility], [address] [place], [place] [address]   Urinalysis, Routine  w reflex microscopic     Status: Abnormal   Collection Time: 11/12/18  6:20 AM  Result Value Ref Range   Color, Urine YELLOW YELLOW   APPearance HAZY (A) CLEAR   Specific Gravity, Urine 1.016 1.005 - 1.030   pH 7.0 5.0 - 8.0   Glucose, UA 50 (A) NEGATIVE mg/dL   Hgb urine dipstick NEGATIVE NEGATIVE  Bilirubin Urine NEGATIVE NEGATIVE   Ketones, ur NEGATIVE NEGATIVE mg/dL   Protein, ur 981 (A) NEGATIVE mg/dL   Nitrite NEGATIVE NEGATIVE   Leukocytes,Ua NEGATIVE NEGATIVE   WBC, UA 0-5 0 - 5 WBC/hpf   Bacteria, UA FEW (A) NONE SEEN   Mucus PRESENT     Comment: Performed at [facility], [address] [place], [place] [address]  Troponin I (High Sensitivity)     Status: None   Collection Time: 11/12/18  8:44 AM  Result Value Ref Range   Troponin I (High Sensitivity) 2 <18 ng/L    Comment: (NOTE) Elevated high sensitivity troponin I (hsTnI) values and significant  changes across serial measurements may suggest ACS but many other  chronic and acute conditions are known to elevate hsTnI results.  Refer to the "Links" section for chest pain algorithms and additional  guidance. Performed at [facility], [address], [place], [place] [address]   SARS Coronavirus 2 [facility] order, Performed in [facility] hospital lab) Nasopharyngeal Nasopharyngeal Swab     Status: None   Collection Time: 11/12/18 10:30 AM   Specimen: Nasopharyngeal Swab  Result Value Ref Range   SARS Coronavirus 2 NEGATIVE NEGATIVE    Comment: (NOTE) If result is NEGATIVE SARS-CoV-2 target nucleic acids are NOT DETECTED. The SARS-CoV-2 RNA is generally detectable in upper and lower  respiratory specimens during the acute phase of infection. The lowest  concentration of SARS-CoV-2 viral copies this assay can detect is 250  copies / mL. A negative result does not preclude SARS-CoV-2 infection  and should not be used as the sole basis for treatment or other  patient management decisions.  A negative result may occur  with  improper specimen collection / handling, submission of specimen other  than nasopharyngeal swab, presence of viral mutation(s) within the  areas targeted by this assay, and inadequate number of viral copies  (<250 copies / mL). A negative result must be combined with clinical  observations, patient history, and epidemiological information. If result is POSITIVE SARS-CoV-2 target nucleic acids are DETECTED. The SARS-CoV-2 RNA is generally detectable in upper and lower  respiratory specimens dur ing the acute phase of infection.  Positive  results are indicative of active infection with SARS-CoV-2.  Clinical  correlation with patient history and other diagnostic information is  necessary to determine patient infection status.  Positive results do  not rule out bacterial infection or co-infection with other viruses. If result is PRESUMPTIVE POSTIVE SARS-CoV-2 nucleic acids MAY BE PRESENT.   A presumptive positive result was obtained on the submitted specimen  and confirmed on repeat testing.  While 2019 novel coronavirus  (SARS-CoV-2) nucleic acids may be present in the submitted sample  additional confirmatory testing may be necessary for epidemiological  and / or clinical management purposes  to differentiate between  SARS-CoV-2 and other Sarbecovirus currently known to infect humans.  If clinically indicated additional testing with an alternate test  methodology [phone]) is advised. The SARS-CoV-2 RNA is generally  detectable in upper and lower respiratory sp ecimens during the acute  phase of infection. The expected result is Negative. Fact Sheet for Patients:  BoilerBrush.com.cy Fact Sheet for Healthcare Providers: https://pope.com/ This test is not yet approved or cleared by the [place] FDA and has been authorized for detection and/or diagnosis of SARS-CoV-2 by FDA under an Emergency Use Authorization (EUA).  This EUA  will remain in effect (meaning this test can be used) for the duration of the COVID-19 declaration  under Section 564(b)(1) of the Act, 21 U.S.C. section 360bbb-3(b)(1), unless the authorization is terminated or revoked sooner. Performed at [facility], [address] [place], [place] [address]     Imaging / Studies: Ct Chest W Contrast  Result Date: 11/12/2018 CLINICAL DATA:  Nausea and vomiting with abdominal pain EXAM: CT CHEST, ABDOMEN, AND PELVIS WITH CONTRAST TECHNIQUE: Multidetector CT imaging of the chest, abdomen and pelvis was performed following the standard protocol during bolus administration of intravenous contrast. CONTRAST:  OMNIPAQUE IOHEXOL 300 MG/ML  SOLN COMPARISON:  01/27/2018 abdominal CT FINDINGS: CT CHEST FINDINGS Cardiovascular: Normal heart size. No pericardial effusion. No acute vascular finding Mediastinum/Nodes: Negative for adenopathy or pneumomediastinum. Normal appearance of the esophagus Lungs/Pleura: Trace left pleural effusion that is low-density. Musculoskeletal: No acute or aggressive finding. CT ABDOMEN PELVIS FINDINGS Lower chest:  No contributory findings. Hepatobiliary: No focal liver abnormality.No evidence of biliary obstruction or stone. Pancreas: Unremarkable. Spleen: Unremarkable. Adrenals/Urinary Tract: Negative adrenals. No hydronephrosis or stone. Unremarkable bladder. Stomach/Bowel: Narrow necked left diaphragmatic hernia with herniated proximal stomach. The herniated stomach is thick walled with submucosal low-density edema, fluid distended, and poorly enhancing compared to the rest of the stomach. No visible perforation. Vascular/Lymphatic: No acute vascular abnormality. No mass or adenopathy. Reproductive:No pathologic findings. Other: No ascites or pneumoperitoneum. Musculoskeletal: No acute abnormalities. These results were called by telephone at the time of interpretation on 11/12/2018 at 8:52 am to Dr. Jodelle , who verbally acknowledged these  results. IMPRESSION: 1. Left diaphragmatic hernia containing proximal stomach. The herniated stomach is markedly edematous and poorly enhancing, findings of strangulation. 2. Left pleural effusion that is presumably sympathetic. No extraluminal gas to suggest perforation at this time. Electronically Signed   By: Marnee Spring M.D.   On: 11/12/2018 08:53   Ct Abdomen Pelvis W Contrast  Result Date: 11/12/2018 CLINICAL DATA:  Nausea and vomiting with abdominal pain EXAM: CT CHEST, ABDOMEN, AND PELVIS WITH CONTRAST TECHNIQUE: Multidetector CT imaging of the chest, abdomen and pelvis was performed following the standard protocol during bolus administration of intravenous contrast. CONTRAST:  OMNIPAQUE IOHEXOL 300 MG/ML  SOLN COMPARISON:  01/27/2018 abdominal CT FINDINGS: CT CHEST FINDINGS Cardiovascular: Normal heart size. No pericardial effusion. No acute vascular finding Mediastinum/Nodes: Negative for adenopathy or pneumomediastinum. Normal appearance of the esophagus Lungs/Pleura: Trace left pleural effusion that is low-density. Musculoskeletal: No acute or aggressive finding. CT ABDOMEN PELVIS FINDINGS Lower chest:  No contributory findings. Hepatobiliary: No focal liver abnormality.No evidence of biliary obstruction or stone. Pancreas: Unremarkable. Spleen: Unremarkable. Adrenals/Urinary Tract: Negative adrenals. No hydronephrosis or stone. Unremarkable bladder. Stomach/Bowel: Narrow necked left diaphragmatic hernia with herniated proximal stomach. The herniated stomach is thick walled with submucosal low-density edema, fluid distended, and poorly enhancing compared to the rest of the stomach. No visible perforation. Vascular/Lymphatic: No acute vascular abnormality. No mass or adenopathy. Reproductive:No pathologic findings. Other: No ascites or pneumoperitoneum. Musculoskeletal: No acute abnormalities. These results were called by telephone at the time of interpretation on 11/12/2018 at 8:52 am to Dr.  Jodelle , who verbally acknowledged these results. IMPRESSION: 1. Left diaphragmatic hernia containing proximal stomach. The herniated stomach is markedly edematous and poorly enhancing, findings of strangulation. 2. Left pleural effusion that is presumably sympathetic. No extraluminal gas to suggest perforation at this time. Electronically Signed   By: Marnee Spring M.D.   On: 11/12/2018 08:53   Dg Abdomen Acute W/chest  Result Date: 11/12/2018 CLINICAL DATA:  Acute onset chest pain and vomiting last night. EXAM: DG ABDOMEN ACUTE  W/ 1V CHEST COMPARISON:  None. FINDINGS: There is no evidence of dilated bowel loops or free intraperitoneal air. No radiopaque calculi or other significant radiographic abnormality is seen. Heart size and mediastinal contours are within normal limits. Both lungs are clear. IMPRESSION: Negative abdominal radiographs.  No active cardiopulmonary disease. Electronically Signed   By: Danae OrleansJohn A Stahl M.D.   On: 11/12/2018 07:15    Medications / Allergies: per chart  Antibiotics: Anti-infectives (From admission, onward)   None        Note: Portions of this report may have been transcribed using voice recognition software. Every effort was made to ensure accuracy; however, inadvertent computerized transcription errors may be present.   Any transcriptional errors that result from this process are unintentional.    Roberto SportsmanSteven C. Gisel Vipond, MD, FACS, MASCRS Gastrointestinal and Minimally Invasive Surgery    [address] [place], [place] [id number] [phone] Main / Paging [phone] Fax   11/12/2018

## 2018-11-12 NOTE — ED Notes (Signed)
Pt anxious requesting pain and nausea meds. Unable to obtain due to delay with pharmacy releasing orders. CIWA at 9 therefore 1mg  Ativan given IV.

## 2018-11-12 NOTE — ED Provider Notes (Signed)
[facility] Emergency Department Provider Note  ____________________________________________   First MD Initiated Contact with Patient 11/11/18 2339     (approximate)  I have reviewed the triage vital signs and the nursing notes.   HISTORY  Chief Complaint Detox/Rehab    HPI Roberto Flores is a 33 y.o. male with a history of alcohol dependence and prior opioid dependence who presents for help with detox.  He says he last had a drink earlier tonight but he feels like a terrible father and husband.  He has no suicidal ideation nor homicidal ideation and knows that he has a problem and would like to get help.  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  He has not had any contact with COVID-19 patients.  He denies sore throat, chest pain, cough, shortness of breath, nausea, vomiting, and abdominal pain.  He is ambulatory without any difficulty and has no other complaints.  Symptoms have been gradually developing over time, are severe, nothing in particular makes it better or worse.         Past Medical History:  Diagnosis Date  . Alcohol dependence (HCC)   . Opioid dependence (HCC) 10/2018   claims he went through rehab for Fentanyl and no longer uses    There are no active problems to display for this patient.   History reviewed. No pertinent surgical history.  Prior to Admission medications   Medication Sig Start Date End Date Taking? Authorizing Provider  ibuprofen (ADVIL,MOTRIN) 600 MG tablet Take 1 tablet (600 mg total) by mouth every 6 (six) hours as needed. Patient not taking: Reported on 11/11/2018 01/27/18   Antony MaduraHumes, Kelly, PA-C  lidocaine (LIDODERM) 5 % Place 1 patch onto the skin daily. Remove & Discard patch within 12 hours or as directed by MD Patient not taking: Reported on 11/11/2018 01/27/18   Antony MaduraHumes, Kelly, PA-C  methocarbamol (ROBAXIN) 500 MG tablet Take 1 tablet (500 mg  total) by mouth every 8 (eight) hours as needed for muscle spasms. Patient not taking: Reported on 11/11/2018 01/27/18   Antony MaduraHumes, Kelly, PA-C  tobramycin (TOBREX) 0.3 % ophthalmic solution Place 2 drops into the left eye every 4 (four) hours. Patient not taking: Reported on 01/27/2018 12/29/17   Tommi RumpsSummers, Rhonda L, PA-C    Allergies Patient has no known allergies.  History reviewed. No pertinent family history.  Social History Social History   Tobacco Use  . Smoking status: Current Every Day Smoker    Packs/day: 1.50    Types: Cigarettes  . Smokeless tobacco: Never Used  Substance Use Topics  . Alcohol use: Yes  . Drug use: Not Currently    Review of Systems Constitutional: No fever/chills Eyes: No visual changes. ENT: No sore throat. Cardiovascular: Denies chest pain. Respiratory: Denies shortness of breath. Gastrointestinal: No abdominal pain.  No nausea, no vomiting.  No diarrhea.  No constipation. Genitourinary: Negative for dysuria. Musculoskeletal: Negative for neck pain.  Negative for back pain. Integumentary: Negative for rash. Neurological: Negative for headaches, focal weakness or numbness. Psychiatric:  Alcohol dependence and prior opioid abuse.  Denies suicidal ideation and homicidal ideation.  ____________________________________________   PHYSICAL EXAM:  VITAL SIGNS: ED Triage Vitals  Enc Vitals Group     BP 11/11/18 2329 138/73     Pulse Rate 11/11/18 2329 99     Resp 11/11/18 2329 20     Temp 11/11/18 2329 98.5 F (36.9 C)  Temp Source 11/11/18 2329 Oral     SpO2 11/11/18 2329 100 %     Weight 11/11/18 2322 77.1 kg (170 lb)     Height 11/11/18 2322 1.854 m (6\' 1" )     Head Circumference --      Peak Flow --      Pain Score --      Pain Loc --      Pain Edu? --      Excl. in [place]? --     Constitutional: Alert and oriented.  Appears clinically sober in spite of his report of recently drinking. Eyes: Conjunctivae are normal.  Head: Atraumatic.  Nose: No congestion/rhinnorhea. Mouth/Throat: Mucous membranes are moist. Neck: No stridor.  No meningeal signs.   Cardiovascular: Normal rate, regular rhythm. Good peripheral circulation. Grossly normal heart sounds. Respiratory: Normal respiratory effort.  No retractions. Gastrointestinal: Soft and nontender. No distention.  Musculoskeletal: No lower extremity tenderness nor edema. No gross deformities of extremities. Neurologic:  Normal speech and language. No gross focal neurologic deficits are appreciated.  Skin:  Skin is warm, dry and intact. Psychiatric: Mood and affect are normal. Speech and behavior are normal.  Good insight and judgment into his dependence issues.  No suicidal ideation.  Appears to have the capacity to make his own decision.  ____________________________________________   LABS (all labs ordered are listed, but only abnormal results are displayed)  Labs Reviewed  COMPREHENSIVE METABOLIC PANEL - Abnormal; Notable for the following components:      Result Value   Potassium 3.0 (*)    CO2 19 (*)    Glucose, Bld 152 (*)    BUN <5 (*)    Total Protein 8.4 (*)    Albumin 5.2 (*)    AST 49 (*)    All other components within normal limits  ETHANOL - Abnormal; Notable for the following components:   Alcohol, Ethyl (B) 245 (*)    All other components within normal limits  CBC - Abnormal; Notable for the following components:   MCH 35.9 (*)    MCHC 36.2 (*)    All other components within normal limits  URINE DRUG SCREEN, QUALITATIVE ([facility] ONLY)   ____________________________________________  EKG  None - EKG not ordered by ED physician ____________________________________________  RADIOLOGY Ursula Alert, personally viewed and evaluated these images (plain radiographs) as part of my medical decision making, as well as reviewing the written report by the radiologist.  ED MD interpretation: No indication for imaging  Official radiology report(s): No  results found.  ____________________________________________   PROCEDURES   Procedure(s) performed (including Critical Care):  Procedures   ____________________________________________   INITIAL IMPRESSION / MDM / [place] / ED COURSE  As part of my medical decision making, I reviewed the following data within the [facility] notes reviewed and incorporated, Labs reviewed , Old chart reviewed, Notes from prior ED visits and [place] Controlled Substance Database   Differential diagnosis includes, but is not limited to, substance abuse (alcohol dependence), substance-induced mood disorder, adjustment disorder, depression.  Patient is showing no signs or symptoms of suicidal ideation and does not meet criteria for involuntary commitment nor inpatient medical or psychiatric treatment.  He has no signs of withdrawal.  Labs are pending.  The patient thinks he may want help at an inpatient facility such as [facility] so I have asked [facility] to get involved and discuss placement.  He is awaiting the results of his lab work.  Clinical Course as of Nov 11 529  Wynelle LinkSun Nov 12, 2018  0031 Alcohol, Ethyl (B)(!): 245 [CF]  0031 Generally reassuring comprehensive metabolic panel other than a mildly decreased potassium for which I have ordered 40 mEq of oral potassium supplement.  CBC is normal.  Urine drug screen is negative.  Comprehensive metabolic panel(!) [CF]  0100 The patient's wife reportedly found a detox place for him and he no longer wants to stay.  He does not meet any criteria for involuntary commitment and appears clinically sober in spite of his ethanol level.  His wife is here and taking him home and he eloped without waiting for discharge paperwork but that is his decision and he has the capacity to make his own decisions.   [CF]    Clinical Course User Index [CF] Loleta RoseForbach, Kellin [place], MD     ____________________________________________  FINAL CLINICAL  IMPRESSION(S) / ED DIAGNOSES  Final diagnoses:  Uncomplicated alcohol dependence (HCC)     MEDICATIONS GIVEN DURING THIS VISIT:  Medications - No data to display   ED Discharge Orders    None      *Please note:  Roberto Flores was evaluated in Emergency Department on 11/12/2018 for the symptoms described in the history of present illness. He was evaluated in the context of the global COVID-19 pandemic, which necessitated consideration that the patient might be at risk for infection with the SARS-CoV-2 virus that causes COVID-19. Institutional protocols and algorithms that pertain to the evaluation of patients at risk for COVID-19 are in a state of rapid change based on information released by regulatory bodies including the CDC and federal and state organizations. These policies and algorithms were followed during the patient's care in the ED.  Some ED evaluations and interventions may be delayed as a result of limited staffing during the pandemic.*  Note:  This document was prepared using Dragon voice recognition software and may include unintentional dictation errors.   Loleta RoseForbach, Chistina Roston, MD 11/12/18 [phone]

## 2018-11-12 NOTE — Transfer of Care (Signed)
Immediate Anesthesia Transfer of Care Note  Patient: Roberto Flores  Procedure(s) Performed: LAPAROSCOPIC REDUCTION OF hiatel hernia repair partial gastrectomy, EGD, DIAPHRAGM AND HERNIA REPAIR (N/A )  Patient Location: PACU  Anesthesia Type:General  Level of Consciousness: sedated and patient cooperative  Airway & Oxygen Therapy: Patient Spontanous Breathing and Patient connected to face mask oxygen  Post-op Assessment: Report given to RN and Post -op Vital signs reviewed and stable  Post vital signs: stable  Last Vitals:  Vitals Value Taken Time  BP    Temp    Pulse    Resp    SpO2      Last Pain:  Vitals:   11/12/18 1430  TempSrc:   PainSc: 10-Worst pain ever         Complications: No apparent anesthesia complications

## 2018-11-12 NOTE — ED Notes (Signed)
Pt more agitated and pulling at covers, talking about things that are not present. Wife at bedside. Updated VS and pt transported to PACU

## 2018-11-12 NOTE — ED Provider Notes (Addendum)
Patient still with epigastric and lower anterior chest pain.  However very functional.  Troponin was negative labs including CBC liver function test lipase without any significant abnormalities.  Blood alcohol level was 189 upon arrival here.  CT scan chest and abdomen with significant findings suggestive of edematous and strangulated hiatal hernia.  Contacted Dr. Arnoldo Morale on-call for general surgery who will come in and see the patient.  Patient has had no further vomiting here.  Other than the vomiting had when he first arrived.  But is been hours now without any vomiting.   Fredia Sorrow, MD 11/12/18 [phone]  Patient seen by Dr. Arnoldo Morale.  Dr. Arnoldo Morale recommended contacting [place] [facility] at [facility].  Spoke with Dr. Georgette Dover.  He got in contact with Dr. Richardson Landry gross.  Patient will be transferred ED to ED to [facility] long for Dr. gross to operatively fix this.  Patient aware of plan.  [facility] will do the transfer.    Fredia Sorrow, MD 11/12/18 1040  Discussed with Dr. Marye Round at [facility] long emergency department he is excepting along with Dr. gross.   Fredia Sorrow, MD 11/12/18 1044

## 2018-11-12 NOTE — ED Notes (Signed)
While this RN was in with another pt this pt walked out. Stated to EDT his wife had found him a rehab facility to go to.

## 2018-11-12 NOTE — Consult Note (Signed)
Reason for Consult: Incarcerated hiatal hernia Referring Physician: Dr. Heath LarkZakowski  Roberto Flores is an 33 y.o. male.  HPI: Patient is a 33 year old white male who was initially seen at [facility] for acute onset of epigastric pain, nausea, and vomiting secondary to alcohol abuse who left the emergency room there and presented to [facility] emergency room with ongoing nausea, vomiting, and epigastric pain.  He has a known history of alcohol and polysubstance abuse.  A CT scan of the abdomen was performed which revealed an edematous and possibly strangulated gastric hiatal hernia.  Patient states that he has 10 out of 10 abdominal pain and last had an episode of emesis 4 hours ago.  Past Medical History:  Diagnosis Date  . Alcohol dependence (HCC)   . Opioid dependence (HCC) 10/2018   claims he went through rehab for Fentanyl and no longer uses    History reviewed. No pertinent surgical history.  No family history on file.  Social History:  reports that he has been smoking cigarettes. He has been smoking about 1.50 packs per day. He has never used smokeless tobacco. He reports current alcohol use. He reports previous drug use.  Allergies: No Known Allergies  Medications: I have reviewed the patient's current medications.  Results for orders placed or performed during the hospital encounter of 11/12/18 (from the past 48 hour(s))  CBC with Differential/Platelet     Status: Abnormal   Collection Time: 11/12/18  5:15 AM  Result Value Ref Range   WBC 8.0 4.0 - 10.5 K/uL   RBC 4.49 4.22 - 5.81 MIL/uL   Hemoglobin 15.9 13.0 - 17.0 g/dL   HCT 40.945.5 81.139.0 - 91.452.0 %   MCV 101.3 (H) 80.0 - 100.0 fL   MCH 35.4 (H) 26.0 - 34.0 pg   MCHC 34.9 30.0 - 36.0 g/dL   RDW 78.212.1 95.611.5 - 21.315.5 %   Platelets 278 150 - 400 K/uL   nRBC 0.0 0.0 - 0.2 %   Neutrophils Relative % 83 %   Neutro Abs 6.6 1.7 - 7.7 K/uL   Lymphocytes Relative 12 %   Lymphs Abs 1.0 0.7 - 4.0 K/uL    Monocytes Relative 5 %   Monocytes Absolute 0.4 0.1 - 1.0 K/uL   Eosinophils Relative 0 %   Eosinophils Absolute 0.0 0.0 - 0.5 K/uL   Basophils Relative 0 %   Basophils Absolute 0.0 0.0 - 0.1 K/uL   Immature Granulocytes 0 %   Abs Immature Granulocytes 0.03 0.00 - 0.07 K/uL    Comment: Performed at [facility], [address] [place], [place] [address]  Comprehensive metabolic panel     Status: Abnormal   Collection Time: 11/12/18  5:15 AM  Result Value Ref Range   Sodium 142 135 - 145 mmol/L   Potassium 3.3 (L) 3.5 - 5.1 mmol/L   Chloride 105 98 - 111 mmol/L   CO2 23 22 - 32 mmol/L   Glucose, Bld 154 (H) 70 - 99 mg/dL   BUN <5 (L) 6 - 20 mg/dL   Creatinine, Ser 8.460.66 0.61 - 1.24 mg/dL   Calcium 9.1 8.9 - 96.210.3 mg/dL   Total Protein 8.0 6.5 - 8.1 g/dL   Albumin 5.0 3.5 - 5.0 g/dL   AST 44 (H) 15 - 41 U/L   ALT 41 0 - 44 U/L   Alkaline Phosphatase 94 38 - 126 U/L   Total Bilirubin 0.6 0.3 - 1.2 mg/dL   GFR calc non Af Amer >  60 >60 mL/min   GFR calc Af Amer >60 >60 mL/min   Anion gap 14 5 - 15    Comment: Performed at [facility], [address] [place], [place] [address]  Lipase, blood     Status: None   Collection Time: 11/12/18  5:15 AM  Result Value Ref Range   Lipase 23 11 - 51 U/L    Comment: Performed at [facility], [address] [place], [place] [address]  Troponin I (High Sensitivity)     Status: None   Collection Time: 11/12/18  5:15 AM  Result Value Ref Range   Troponin I (High Sensitivity) 2 <18 ng/L    Comment: (NOTE) Elevated high sensitivity troponin I (hsTnI) values and significant  changes across serial measurements may suggest ACS but many other  chronic and acute conditions are known to elevate hsTnI results.  Refer to the Links section for chest pain algorithms and additional  guidance. Performed at [facility], [address] [place], [place] [address]   Ethanol     Status: Abnormal   Collection Time: 11/12/18  5:15 AM  Result Value Ref Range    Alcohol, Ethyl (B) 189 (H) <10 mg/dL    Comment: (NOTE) Lowest detectable limit for serum alcohol is 10 mg/dL. For medical purposes only. Performed at [facility], [address] [place], [place] [address]   Acetaminophen level     Status: Abnormal   Collection Time: 11/12/18  5:15 AM  Result Value Ref Range   Acetaminophen (Tylenol), Serum <10 (L) 10 - 30 ug/mL    Comment: (NOTE) Therapeutic concentrations vary significantly. A range of 10-30 ug/mL  may be an effective concentration for many patients. However, some  are best treated at concentrations outside of this range. Acetaminophen concentrations >150 ug/mL at 4 hours after ingestion  and >50 ug/mL at 12 hours after ingestion are often associated with  toxic reactions. Performed at [facility], [address] [place], [place] [address]   Salicylate level     Status: None   Collection Time: 11/12/18  5:15 AM  Result Value Ref Range   Salicylate Lvl <7.0 2.8 - 30.0 mg/dL    Comment: Performed at [facility], [address] [place], [place] [address]  Rapid urine drug screen (hospital performed)     Status: Abnormal   Collection Time: 11/12/18  6:20 AM  Result Value Ref Range   Opiates NONE DETECTED NONE DETECTED   Cocaine NONE DETECTED NONE DETECTED   Benzodiazepines NONE DETECTED NONE DETECTED   Amphetamines NONE DETECTED NONE DETECTED   Tetrahydrocannabinol POSITIVE (A) NONE DETECTED   Barbiturates NONE DETECTED NONE DETECTED    Comment: (NOTE) DRUG SCREEN FOR MEDICAL PURPOSES ONLY.  IF CONFIRMATION IS NEEDED FOR ANY PURPOSE, NOTIFY LAB WITHIN 5 DAYS. LOWEST DETECTABLE LIMITS FOR URINE DRUG SCREEN Drug Class                     Cutoff (ng/mL) Amphetamine and metabolites    1000 Barbiturate and metabolites    200 Benzodiazepine                 200 Tricyclics and metabolites     300 Opiates and metabolites        300 Cocaine and metabolites        300 THC  50 Performed at [facility], [address] [place], [place] [address]   Urinalysis, Routine w reflex microscopic     Status: Abnormal   Collection Time: 11/12/18  6:20 AM  Result Value Ref Range   Color, Urine YELLOW YELLOW   APPearance HAZY (A) CLEAR   Specific Gravity, Urine 1.016 1.005 - 1.030   pH 7.0 5.0 - 8.0   Glucose, UA 50 (A) NEGATIVE mg/dL   Hgb urine dipstick NEGATIVE NEGATIVE   Bilirubin Urine NEGATIVE NEGATIVE   Ketones, ur NEGATIVE NEGATIVE mg/dL   Protein, ur 295100 (A) NEGATIVE mg/dL   Nitrite NEGATIVE NEGATIVE   Leukocytes,Ua NEGATIVE NEGATIVE   WBC, UA 0-5 0 - 5 WBC/hpf   Bacteria, UA FEW (A) NONE SEEN   Mucus PRESENT     Comment: Performed at [facility], [address] [place], [place] [address]  Troponin I (High Sensitivity)     Status: None   Collection Time: 11/12/18  8:44 AM  Result Value Ref Range   Troponin I (High Sensitivity) 2 <18 ng/L    Comment: (NOTE) Elevated high sensitivity troponin I (hsTnI) values and significant  changes across serial measurements may suggest ACS but many other  chronic and acute conditions are known to elevate hsTnI results.  Refer to the "Links" section for chest pain algorithms and additional  guidance. Performed at [facility], [address] [place], [place] [address]     Ct Chest W Contrast  Result Date: 11/12/2018 CLINICAL DATA:  Nausea and vomiting with abdominal pain EXAM: CT CHEST, ABDOMEN, AND PELVIS WITH CONTRAST TECHNIQUE: Multidetector CT imaging of the chest, abdomen and pelvis was performed following the standard protocol during bolus administration of intravenous contrast. CONTRAST:  100mL OMNIPAQUE IOHEXOL 300 MG/ML  SOLN COMPARISON:  01/27/2018 abdominal CT FINDINGS: CT CHEST FINDINGS Cardiovascular: Normal heart size. No pericardial effusion. No acute vascular finding Mediastinum/Nodes: Negative for adenopathy or pneumomediastinum. Normal appearance of the esophagus Lungs/Pleura: Trace left pleural effusion that is low-density.  Musculoskeletal: No acute or aggressive finding. CT ABDOMEN PELVIS FINDINGS Lower chest:  No contributory findings. Hepatobiliary: No focal liver abnormality.No evidence of biliary obstruction or stone. Pancreas: Unremarkable. Spleen: Unremarkable. Adrenals/Urinary Tract: Negative adrenals. No hydronephrosis or stone. Unremarkable bladder. Stomach/Bowel: Narrow necked left diaphragmatic hernia with herniated proximal stomach. The herniated stomach is thick walled with submucosal low-density edema, fluid distended, and poorly enhancing compared to the rest of the stomach. No visible perforation. Vascular/Lymphatic: No acute vascular abnormality. No mass or adenopathy. Reproductive:No pathologic findings. Other: No ascites or pneumoperitoneum. Musculoskeletal: No acute abnormalities. These results were called by telephone at the time of interpretation on 11/12/2018 at 8:52 am to Dr. Jodelle GrossZakowski, who verbally acknowledged these results. IMPRESSION: 1. Left diaphragmatic hernia containing proximal stomach. The herniated stomach is markedly edematous and poorly enhancing, findings of strangulation. 2. Left pleural effusion that is presumably sympathetic. No extraluminal gas to suggest perforation at this time. Electronically Signed   By: Marnee SpringJonathon  Watts M.D.   On: 11/12/2018 08:53   Ct Abdomen Pelvis W Contrast  Result Date: 11/12/2018 CLINICAL DATA:  Nausea and vomiting with abdominal pain EXAM: CT CHEST, ABDOMEN, AND PELVIS WITH CONTRAST TECHNIQUE: Multidetector CT imaging of the chest, abdomen and pelvis was performed following the standard protocol during bolus administration of intravenous contrast. CONTRAST:  100mL OMNIPAQUE IOHEXOL 300 MG/ML  SOLN COMPARISON:  01/27/2018 abdominal CT FINDINGS: CT CHEST FINDINGS Cardiovascular: Normal heart size. No pericardial effusion. No acute vascular finding Mediastinum/Nodes: Negative for adenopathy or  pneumomediastinum. Normal appearance of the esophagus Lungs/Pleura: Trace  left pleural effusion that is low-density. Musculoskeletal: No acute or aggressive finding. CT ABDOMEN PELVIS FINDINGS Lower chest:  No contributory findings. Hepatobiliary: No focal liver abnormality.No evidence of biliary obstruction or stone. Pancreas: Unremarkable. Spleen: Unremarkable. Adrenals/Urinary Tract: Negative adrenals. No hydronephrosis or stone. Unremarkable bladder. Stomach/Bowel: Narrow necked left diaphragmatic hernia with herniated proximal stomach. The herniated stomach is thick walled with submucosal low-density edema, fluid distended, and poorly enhancing compared to the rest of the stomach. No visible perforation. Vascular/Lymphatic: No acute vascular abnormality. No mass or adenopathy. Reproductive:No pathologic findings. Other: No ascites or pneumoperitoneum. Musculoskeletal: No acute abnormalities. These results were called by telephone at the time of interpretation on 11/12/2018 at 8:52 am to Dr. Venita Sheffield, who verbally acknowledged these results. IMPRESSION: 1. Left diaphragmatic hernia containing proximal stomach. The herniated stomach is markedly edematous and poorly enhancing, findings of strangulation. 2. Left pleural effusion that is presumably sympathetic. No extraluminal gas to suggest perforation at this time. Electronically Signed   By: Monte Fantasia M.D.   On: 11/12/2018 08:53   Dg Abdomen Acute W/chest  Result Date: 11/12/2018 CLINICAL DATA:  Acute onset chest pain and vomiting last night. EXAM: DG ABDOMEN ACUTE W/ 1V CHEST COMPARISON:  None. FINDINGS: There is no evidence of dilated bowel loops or free intraperitoneal air. No radiopaque calculi or other significant radiographic abnormality is seen. Heart size and mediastinal contours are within normal limits. Both lungs are clear. IMPRESSION: Negative abdominal radiographs.  No active cardiopulmonary disease. Electronically Signed   By: Marlaine Hind M.D.   On: 11/12/2018 07:15    ROS:  Pertinent items are noted in  HPI.  Blood pressure (!) 149/88, pulse 69, temperature (!) 97.5 F (36.4 C), temperature source Axillary, resp. rate (!) 25, height 6\' 1"  (1.854 m), weight 77.1 kg, SpO2 100 %. Physical Exam: Anxious white male in moderate distress secondary to epigastric pain. Head is normocephalic, atraumatic Lungs are clear to auscultation with good breath sounds bilaterally Heart examination reveals a regular rate and rhythm without S3, S4, murmurs Abdomen is soft and flat with significant tenderness in the upper half of his abdomen.  No rigidity is noted at the present time.  CT scan images personally reviewed Assessment/Plan: Impression: Acute onset of epigastric pain secondary to a narrow necked gastric hiatal hernia.  The gastric wall appears edematous.  There is no pneumoperitoneum.  His white blood cell count is normal at the present time. Plan: Have recommended transfer to [facility] for further evaluation and treatment as the surgical treatment may be more extensive than can be handled at [facility].  Patient and wife are aware of the possible need for surgical intervention.  They are agreeable to transfer.  Aviva Signs 11/12/2018, 10:17 AM

## 2018-11-13 ENCOUNTER — Inpatient Hospital Stay (HOSPITAL_COMMUNITY): Payer: BLUE CROSS/BLUE SHIELD

## 2018-11-13 ENCOUNTER — Encounter (HOSPITAL_COMMUNITY): Payer: Self-pay | Admitting: Surgery

## 2018-11-13 LAB — CBC
HCT: 34 % — ABNORMAL LOW (ref 39.0–52.0)
Hemoglobin: 11.8 g/dL — ABNORMAL LOW (ref 13.0–17.0)
MCH: 36.9 pg — ABNORMAL HIGH (ref 26.0–34.0)
MCHC: 34.7 g/dL (ref 30.0–36.0)
MCV: 106.3 fL — ABNORMAL HIGH (ref 80.0–100.0)
Platelets: 136 10*3/uL — ABNORMAL LOW (ref 150–400)
RBC: 3.2 MIL/uL — ABNORMAL LOW (ref 4.22–5.81)
RDW: 12.2 % (ref 11.5–15.5)
WBC: 11.3 10*3/uL — ABNORMAL HIGH (ref 4.0–10.5)
nRBC: 0 % (ref 0.0–0.2)

## 2018-11-13 LAB — BASIC METABOLIC PANEL
Anion gap: 8 (ref 5–15)
BUN: 9 mg/dL (ref 6–20)
CO2: 27 mmol/L (ref 22–32)
Calcium: 7.8 mg/dL — ABNORMAL LOW (ref 8.9–10.3)
Chloride: 105 mmol/L (ref 98–111)
Creatinine, Ser: 0.67 mg/dL (ref 0.61–1.24)
GFR calc Af Amer: >60 mL/min (ref >60)
GFR calc non Af Amer: >60 mL/min (ref >60)
Glucose, Bld: 120 mg/dL — ABNORMAL HIGH (ref 70–99)
Potassium: 3.3 mmol/L — ABNORMAL LOW (ref 3.5–5.1)
Sodium: 140 mmol/L (ref 135–145)

## 2018-11-13 LAB — MAGNESIUM: Magnesium: 1.6 mg/dL — ABNORMAL LOW (ref 1.7–2.4)

## 2018-11-13 LAB — ABO/RH: ABO/RH(D): A POS

## 2018-11-13 MED ORDER — MAGNESIUM SULFATE 2 GM/50ML IV SOLN
2.0000 g | Freq: Once | INTRAVENOUS | Status: AC
  Administered 2018-11-13: 09:00:00 2 g via INTRAVENOUS
  Filled 2018-11-13: qty 50

## 2018-11-13 MED ORDER — POTASSIUM CHLORIDE 10 MEQ/100ML IV SOLN
10.0000 meq | INTRAVENOUS | Status: AC
  Administered 2018-11-13 (×4): 10 meq via INTRAVENOUS
  Filled 2018-11-13 (×4): qty 100

## 2018-11-13 MED ORDER — LABETALOL HCL 5 MG/ML IV SOLN
INTRAVENOUS | Status: AC
  Filled 2018-11-13: qty 4

## 2018-11-13 NOTE — Progress Notes (Signed)
Patient ID: Roberto Flores, male   DOB: 05/04/1985, 33 y.o.   MRN: [id number]    1 Day Post-Op  Subjective: Sleeping, on precedex and just got 2 mg of dilaudid for pain.  Events from overnight noted.  NGT back in place with minimal output.  Objective: Vital signs in last 24 hours: Temp:  [97.8 F (36.6 C)-99.7 F (37.6 C)] 98.2 F (36.8 C) (08/17 0800) Pulse Rate:  [64-99] 72 (08/17 0700) Resp:  [0-30] 19 (08/17 0700) BP: (135-164)/(76-103) 145/92 (08/17 0700) SpO2:  [97 %-100 %] 100 % (08/17 0700)    Intake/Output from previous day: 08/16 0701 - 08/17 0700 In: 6426.9 [I.V.:5127.3; IV Piggyback:1299.6] Out: 2920 [Urine:2380; Drains:240; Blood:300] Intake/Output this shift: Total I/O In: 200.8 [I.V.:200.8] Out: 20 [Drains:20]  PE: Gen: sleeping currently.  In mittens and wrist restraints Heart: regular, mildly tachy Lungs: CTAB Abd: soft, incisions clean and intact, some old drainage noted on gauze under tegaderms.  JP drain with serosang output.  Lab Results:  Recent Labs    11/12/18 0515 11/13/18 0616  WBC 8.0 11.3*  HGB 15.9 11.8*  HCT 45.5 34.0*  PLT 278 136*   BMET Recent Labs    11/12/18 0515 11/13/18 0616  NA 142 140  K 3.3* 3.3*  CL 105 105  CO2 23 27  GLUCOSE 154* 120*  BUN <5* 9  CREATININE 0.66 0.67  CALCIUM 9.1 7.8*   PT/INR No results for input(s): LABPROT, INR in the last 72 hours. CMP     Component Value Date/Time   NA 140 11/13/2018 0616   NA 139 03/09/2012 1558   K 3.3 (L) 11/13/2018 0616   K 3.6 03/09/2012 1558   CL 105 11/13/2018 0616   CL 104 03/09/2012 1558   CO2 27 11/13/2018 0616   CO2 25 03/09/2012 1558   GLUCOSE 120 (H) 11/13/2018 0616   GLUCOSE 74 03/09/2012 1558   BUN 9 11/13/2018 0616   BUN 12 03/09/2012 1558   CREATININE 0.67 11/13/2018 0616   CREATININE 0.79 03/09/2012 1558   CALCIUM 7.8 (L) 11/13/2018 0616   CALCIUM 9.1 03/09/2012 1558   PROT 8.0 11/12/2018 0515   PROT 7.7 03/09/2012 1558   ALBUMIN 5.0  11/12/2018 0515   ALBUMIN 4.5 03/09/2012 1558   AST 44 (H) 11/12/2018 0515   AST 25 03/09/2012 1558   ALT 41 11/12/2018 0515   ALT 22 03/09/2012 1558   ALKPHOS 94 11/12/2018 0515   ALKPHOS 84 03/09/2012 1558   BILITOT 0.6 11/12/2018 0515   BILITOT 0.8 03/09/2012 1558   GFRNONAA >60 11/13/2018 0616   GFRNONAA >60 03/09/2012 1558   GFRAA >60 11/13/2018 0616   GFRAA >60 03/09/2012 1558   Lipase     Component Value Date/Time   LIPASE 23 11/12/2018 0515       Studies/Results: Dg Abd 1 View  Result Date: 11/13/2018 CLINICAL DATA:  NG tube placement. Post repair of diaphragmatic hernia strangulated in stomach with partial gastrectomy. EXAM: ABDOMEN - 1 VIEW COMPARISON:  Radiographs and CT yesterday FINDINGS: Tip and side port of the enteric tube below the diaphragm in the stomach. Enteric sutures noted in the left upper quadrant. Slight gaseous distention of bowel loops in the central abdomen. Multiple overlying monitoring devices in place. IMPRESSION: Tip and side port of the enteric tube below the diaphragm in the stomach. Electronically Signed   By: Keith Rake M.D.   On: 11/13/2018 01:28   Ct Chest W Contrast  Result Date: 11/12/2018 CLINICAL DATA:  Nausea and vomiting with abdominal pain EXAM: CT CHEST, ABDOMEN, AND PELVIS WITH CONTRAST TECHNIQUE: Multidetector CT imaging of the chest, abdomen and pelvis was performed following the standard protocol during bolus administration of intravenous contrast. CONTRAST:  100mL OMNIPAQUE IOHEXOL 300 MG/ML  SOLN COMPARISON:  01/27/2018 abdominal CT FINDINGS: CT CHEST FINDINGS Cardiovascular: Normal heart size. No pericardial effusion. No acute vascular finding Mediastinum/Nodes: Negative for adenopathy or pneumomediastinum. Normal appearance of the esophagus Lungs/Pleura: Trace left pleural effusion that is low-density. Musculoskeletal: No acute or aggressive finding. CT ABDOMEN PELVIS FINDINGS Lower chest:  No contributory findings.  Hepatobiliary: No focal liver abnormality.No evidence of biliary obstruction or stone. Pancreas: Unremarkable. Spleen: Unremarkable. Adrenals/Urinary Tract: Negative adrenals. No hydronephrosis or stone. Unremarkable bladder. Stomach/Bowel: Narrow necked left diaphragmatic hernia with herniated proximal stomach. The herniated stomach is thick walled with submucosal low-density edema, fluid distended, and poorly enhancing compared to the rest of the stomach. No visible perforation. Vascular/Lymphatic: No acute vascular abnormality. No mass or adenopathy. Reproductive:No pathologic findings. Other: No ascites or pneumoperitoneum. Musculoskeletal: No acute abnormalities. These results were called by telephone at the time of interpretation on 11/12/2018 at 8:52 am to Dr. Jodelle GrossZakowski, who verbally acknowledged these results. IMPRESSION: 1. Left diaphragmatic hernia containing proximal stomach. The herniated stomach is markedly edematous and poorly enhancing, findings of strangulation. 2. Left pleural effusion that is presumably sympathetic. No extraluminal gas to suggest perforation at this time. Electronically Signed   By: Marnee SpringJonathon  Watts M.D.   On: 11/12/2018 08:53   Ct Abdomen Pelvis W Contrast  Result Date: 11/12/2018 CLINICAL DATA:  Nausea and vomiting with abdominal pain EXAM: CT CHEST, ABDOMEN, AND PELVIS WITH CONTRAST TECHNIQUE: Multidetector CT imaging of the chest, abdomen and pelvis was performed following the standard protocol during bolus administration of intravenous contrast. CONTRAST:  100mL OMNIPAQUE IOHEXOL 300 MG/ML  SOLN COMPARISON:  01/27/2018 abdominal CT FINDINGS: CT CHEST FINDINGS Cardiovascular: Normal heart size. No pericardial effusion. No acute vascular finding Mediastinum/Nodes: Negative for adenopathy or pneumomediastinum. Normal appearance of the esophagus Lungs/Pleura: Trace left pleural effusion that is low-density. Musculoskeletal: No acute or aggressive finding. CT ABDOMEN PELVIS  FINDINGS Lower chest:  No contributory findings. Hepatobiliary: No focal liver abnormality.No evidence of biliary obstruction or stone. Pancreas: Unremarkable. Spleen: Unremarkable. Adrenals/Urinary Tract: Negative adrenals. No hydronephrosis or stone. Unremarkable bladder. Stomach/Bowel: Narrow necked left diaphragmatic hernia with herniated proximal stomach. The herniated stomach is thick walled with submucosal low-density edema, fluid distended, and poorly enhancing compared to the rest of the stomach. No visible perforation. Vascular/Lymphatic: No acute vascular abnormality. No mass or adenopathy. Reproductive:No pathologic findings. Other: No ascites or pneumoperitoneum. Musculoskeletal: No acute abnormalities. These results were called by telephone at the time of interpretation on 11/12/2018 at 8:52 am to Dr. Jodelle GrossZakowski, who verbally acknowledged these results. IMPRESSION: 1. Left diaphragmatic hernia containing proximal stomach. The herniated stomach is markedly edematous and poorly enhancing, findings of strangulation. 2. Left pleural effusion that is presumably sympathetic. No extraluminal gas to suggest perforation at this time. Electronically Signed   By: Marnee SpringJonathon  Watts M.D.   On: 11/12/2018 08:53   Dg Abdomen Acute W/chest  Result Date: 11/12/2018 CLINICAL DATA:  Acute onset chest pain and vomiting last night. EXAM: DG ABDOMEN ACUTE W/ 1V CHEST COMPARISON:  None. FINDINGS: There is no evidence of dilated bowel loops or free intraperitoneal air. No radiopaque calculi or other significant radiographic abnormality is seen. Heart size and mediastinal contours are within normal limits. Both lungs are clear. IMPRESSION: Negative abdominal radiographs.  No active cardiopulmonary disease. Electronically Signed   By: Danae OrleansJohn A Stahl M.D.   On: 11/12/2018 07:15    Anti-infectives: Anti-infectives (From admission, onward)   Start     Dose/Rate Route Frequency Ordered Stop   11/12/18 2300   piperacillin-tazobactam (ZOSYN) IVPB 3.375 g     3.375 g 12.5 mL/hr over 240 Minutes Intravenous Every 8 hours 11/12/18 2240 11/16/18 2159   11/12/18 1230  cefTRIAXone (ROCEPHIN) 2 g in sodium chloride 0.9 % 100 mL IVPB     2 g 200 mL/hr over 30 Minutes Intravenous On call to O.R. 11/12/18 1202 11/12/18 1626   11/12/18 1230  metroNIDAZOLE (FLAGYL) IVPB 500 mg  Status:  Discontinued     500 mg 100 mL/hr over 60 Minutes Intravenous Every 8 hours 11/12/18 1202 11/12/18 2309       Assessment/Plan ETOH abuse/withdrawal  - on precedex/CIWA protocol Polysubstance abuse - will be difficult to control pain  POD 1, s/p laparoscopic reduction of strangulated diaphragmatic hernia with partial gastrectomy and primary hiatal hernia repair with phasics mesh, Dr. Michaell CowingGross 8/16  -keep NGT in if able.  Plan for removal when return of bowel function occurs, but would get UGI prior to NGT removal to evaluate anatomy per Dr. Michaell CowingGross -4 days of post operative abx therapy, on zosyn -protonix BID -mobilize as able  -pulm toilet -cont JP drain  FEN - NPO, IVFs, NGT VTE - Lovenox ID - zosyn 8/16 -->   LOS: 1 day    Letha CapeKelly E Exie Chrismer , [facility] 11/13/2018, 8:44 AM Pager: [phone]

## 2018-11-13 NOTE — TOC Initial Note (Signed)
Transition of Care [facility]) - Initial/Assessment Note    Patient Details  Name: Roberto Flores MRN: [id number] Date of Birth: 07/08/1985  Transition of Care [facility]) CM/SW Contact:    Laren Whaling, Marjie Skiff, RN Phone Number: 11/13/2018, 10:14 AM  Clinical Narrative:                  s/p laparoscopic reduction of strangulated diaphragmatic hernia with partial gastrectomy and primary hiatal hernia repair with phasics mesh, Dr. Johney Maine 8/16  On CIWA for ETOH. TOC will continue to follow and GIve ETOH resources closer to [place].  Expected Discharge Plan: Home/Self Care Barriers to Discharge: Continued Medical Work up   Patient Goals and CMS Choice        Expected Discharge Plan and Services Expected Discharge Plan: Home/Self Care       Living arrangements for the past 2 months: Single Family Home                                      Prior Living Arrangements/Services Living arrangements for the past 2 months: Single Family Home Lives with:: Spouse                   Activities of Daily Living      Permission Sought/Granted                  Emotional Assessment         Alcohol / Substance Use: Alcohol Use, Tobacco Use    Admission diagnosis:  Incarcerated hiatal hernia [K44.0] Alcohol abuse [F10.10] Epigastric pain [R10.13] Patient Active Problem List   Diagnosis Date Noted  . Diaphragmatic hernia stangulating stomach s/p repair & partial gastrectomy 11/12/2018 11/12/2018  . Alcohol abuse 11/12/2018  . Episodic cannabis use 11/12/2018  . Nausea and vomiting 11/12/2018  . Polysubstance abuse ([place]) 11/12/2018  . History of methylenedioxymethamphetamine (MDMA) "Molly" use 11/12/2018  . Acute generalized peritonitis ([place]) 11/12/2018  . Chest pain, non-cardiac 11/12/2018  . Status post laparoscopic fundoplication 17/51/0258   PCP:  Patient, No Pcp Per Pharmacy:   [id number] [address], [place] [address] Phone: [phone] Fax: [phone]     Social Determinants of Health (SDOH) Interventions    Readmission Risk Interventions Readmission Risk Prevention Plan 11/13/2018  Transportation Screening Complete  PCP or Specialist Appt within 3-5 Days Not Complete  Not Complete comments Not ready for dc  HRI or [place] Not Complete  HRI or Home Care Consult comments NA  Social Work Consult for [place] Planning/Counseling Not Complete  SW consult not completed comments Pt in SDU with NGT. Will do when more with it.  Palliative Care Screening Not Applicable  Medication Review (RN Care Manager) Complete  Some recent data might be hidden   Marney Doctor RN,BSN

## 2018-11-14 LAB — CBC
HCT: 36.4 % — ABNORMAL LOW (ref 39.0–52.0)
Hemoglobin: 12.6 g/dL — ABNORMAL LOW (ref 13.0–17.0)
MCH: 36.2 pg — ABNORMAL HIGH (ref 26.0–34.0)
MCHC: 34.6 g/dL (ref 30.0–36.0)
MCV: 104.6 fL — ABNORMAL HIGH (ref 80.0–100.0)
Platelets: 101 10*3/uL — ABNORMAL LOW (ref 150–400)
RBC: 3.48 MIL/uL — ABNORMAL LOW (ref 4.22–5.81)
RDW: 11.9 % (ref 11.5–15.5)
WBC: 8.9 10*3/uL (ref 4.0–10.5)
nRBC: 0 % (ref 0.0–0.2)

## 2018-11-14 LAB — MAGNESIUM: Magnesium: 2.1 mg/dL (ref 1.7–2.4)

## 2018-11-14 LAB — BASIC METABOLIC PANEL
Anion gap: 5 (ref 5–15)
BUN: 8 mg/dL (ref 6–20)
CO2: 27 mmol/L (ref 22–32)
Calcium: 7.8 mg/dL — ABNORMAL LOW (ref 8.9–10.3)
Chloride: 101 mmol/L (ref 98–111)
Creatinine, Ser: 0.67 mg/dL (ref 0.61–1.24)
GFR calc Af Amer: >60 mL/min (ref >60)
GFR calc non Af Amer: >60 mL/min (ref >60)
Glucose, Bld: 100 mg/dL — ABNORMAL HIGH (ref 70–99)
Potassium: 3.4 mmol/L — ABNORMAL LOW (ref 3.5–5.1)
Sodium: 133 mmol/L — ABNORMAL LOW (ref 135–145)

## 2018-11-14 MED ORDER — POTASSIUM CHLORIDE 10 MEQ/100ML IV SOLN
10.0000 meq | INTRAVENOUS | Status: AC
  Administered 2018-11-14 (×6): 10 meq via INTRAVENOUS
  Filled 2018-11-14 (×6): qty 100

## 2018-11-14 NOTE — Progress Notes (Addendum)
Patient ID: Mellody MemosJustin Jobst, male   DOB: 03/30/1985, 33 y.o.   MRN: [id number]    2 Days Post-Op  Subjective: Patient complains of pain today with movement.  No flatus yet.  Out of restraints, but still on precedex  Objective: Vital signs in last 24 hours: Temp:  [97.7 F (36.5 C)-99 F (37.2 C)] 97.9 F (36.6 C) (08/18 0800) Pulse Rate:  [63-77] 63 (08/18 0800) Resp:  [13-21] 16 (08/18 0800) BP: (132-154)/(80-102) 132/85 (08/18 0800) SpO2:  [98 %-100 %] 99 % (08/18 0800)    Intake/Output from previous day: 08/17 0701 - 08/18 0700 In: 2412.4 [I.V.:1873.2; IV Piggyback:539.3] Out: 3810 [Urine:3550; Emesis/NG output:100; Drains:160] Intake/Output this shift: Total I/O In: 312.4 [I.V.:312.4] Out: -   PE: Heart: regular Lungs: CTAB Abd: soft, tender diffusely as expected. Incisions are c/d/i with old drainage noted.  JP drain with serosang output.  Few BS.  NGt with minimal output.  Lab Results:  Recent Labs    11/13/18 0616 11/14/18 0223  WBC 11.3* 8.9  HGB 11.8* 12.6*  HCT 34.0* 36.4*  PLT 136* 101*   BMET Recent Labs    11/13/18 0616 11/14/18 0223  NA 140 133*  K 3.3* 3.4*  CL 105 101  CO2 27 27  GLUCOSE 120* 100*  BUN 9 8  CREATININE 0.67 0.67  CALCIUM 7.8* 7.8*   PT/INR No results for input(s): LABPROT, INR in the last 72 hours. CMP     Component Value Date/Time   NA 133 (L) 11/14/2018 0223   NA 139 03/09/2012 1558   K 3.4 (L) 11/14/2018 0223   K 3.6 03/09/2012 1558   CL 101 11/14/2018 0223   CL 104 03/09/2012 1558   CO2 27 11/14/2018 0223   CO2 25 03/09/2012 1558   GLUCOSE 100 (H) 11/14/2018 0223   GLUCOSE 74 03/09/2012 1558   BUN 8 11/14/2018 0223   BUN 12 03/09/2012 1558   CREATININE 0.67 11/14/2018 0223   CREATININE 0.79 03/09/2012 1558   CALCIUM 7.8 (L) 11/14/2018 0223   CALCIUM 9.1 03/09/2012 1558   PROT 8.0 11/12/2018 0515   PROT 7.7 03/09/2012 1558   ALBUMIN 5.0 11/12/2018 0515   ALBUMIN 4.5 03/09/2012 1558   AST 44 (H) 11/12/2018  0515   AST 25 03/09/2012 1558   ALT 41 11/12/2018 0515   ALT 22 03/09/2012 1558   ALKPHOS 94 11/12/2018 0515   ALKPHOS 84 03/09/2012 1558   BILITOT 0.6 11/12/2018 0515   BILITOT 0.8 03/09/2012 1558   GFRNONAA >60 11/14/2018 0223   GFRNONAA >60 03/09/2012 1558   GFRAA >60 11/14/2018 0223   GFRAA >60 03/09/2012 1558   Lipase     Component Value Date/Time   LIPASE 23 11/12/2018 0515       Studies/Results: Dg Abd 1 View  Result Date: 11/13/2018 CLINICAL DATA:  NG tube placement. Post repair of diaphragmatic hernia strangulated in stomach with partial gastrectomy. EXAM: ABDOMEN - 1 VIEW COMPARISON:  Radiographs and CT yesterday FINDINGS: Tip and side port of the enteric tube below the diaphragm in the stomach. Enteric sutures noted in the left upper quadrant. Slight gaseous distention of bowel loops in the central abdomen. Multiple overlying monitoring devices in place. IMPRESSION: Tip and side port of the enteric tube below the diaphragm in the stomach. Electronically Signed   By: Narda RutherfordMelanie  Sanford M.D.   On: 11/13/2018 01:28    Anti-infectives: Anti-infectives (From admission, onward)   Start     Dose/Rate Route Frequency Ordered Stop  11/12/18 2300  piperacillin-tazobactam (ZOSYN) IVPB 3.375 g     3.375 g 12.5 mL/hr over 240 Minutes Intravenous Every 8 hours 11/12/18 2240 11/16/18 2159   11/12/18 1230  cefTRIAXone (ROCEPHIN) 2 g in sodium chloride 0.9 % 100 mL IVPB     2 g 200 mL/hr over 30 Minutes Intravenous On call to O.R. 11/12/18 1202 11/12/18 1626   11/12/18 1230  metroNIDAZOLE (FLAGYL) IVPB 500 mg  Status:  Discontinued     500 mg 100 mL/hr over 60 Minutes Intravenous Every 8 hours 11/12/18 1202 11/12/18 2309       Assessment/Plan ETOH abuse/withdrawal  - on precedex/CIWA protocol Polysubstance abuse - will be difficult to control pain Hypokalemia - replace K again today, check BMET in am  POD 2, s/p laparoscopic reduction of strangulated diaphragmatic hernia  with partial gastrectomy and primary hiatal hernia repair with phasics mesh, Dr. Johney Maine 8/16   -keep NGT. (that is a tiny NGT)  Plan for removal when return of bowel function occurs or POD 3-4, but would get UGI prior to NGT removal to evaluate anatomy per Dr. Johney Maine  -4 days of post operative abx therapy, on zosyn  -protonix BID -mobilize as able  -pulm toilet -cont JP drain WBC  normalized  FEN - NPO, IVFs, NGT VTE - Lovenox ID - zosyn 8/16 -->   LOS: 2 days    Henreitta Cea , [facility] 11/14/2018, 8:54 AM Pager: [phone]  Agree with above. Complains of LUQ pain/left chest pain on inspiration.  Has breath sounds present. Pulls on 400 in IS. Needs to ambulate.  Married.  Has twin 31 yo.  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  Alphonsa Overall, MD, [facility] Pager: [phone] Office phone:  [phone]

## 2018-11-14 NOTE — Progress Notes (Signed)
Patient has been difficult to mobilize due to pain .  OOB chair x 1 today, not able to walk in halls.  Requiring Dilaudid 2 mg every 2 hours .  Arouses easily.  Weaned and stopped Precedex drip, however c/o of more pain and asking for something to sedate him.  Ativan 2 mg given , Robaxin given helped some per patient.  Restarted Precedex drip.  Appears more comfortable now.

## 2018-11-15 ENCOUNTER — Inpatient Hospital Stay (HOSPITAL_COMMUNITY): Payer: BLUE CROSS/BLUE SHIELD

## 2018-11-15 LAB — CBC
HCT: 34.8 % — ABNORMAL LOW (ref 39.0–52.0)
Hemoglobin: 12.1 g/dL — ABNORMAL LOW (ref 13.0–17.0)
MCH: 35.5 pg — ABNORMAL HIGH (ref 26.0–34.0)
MCHC: 34.8 g/dL (ref 30.0–36.0)
MCV: 102.1 fL — ABNORMAL HIGH (ref 80.0–100.0)
Platelets: 121 10*3/uL — ABNORMAL LOW (ref 150–400)
RBC: 3.41 MIL/uL — ABNORMAL LOW (ref 4.22–5.81)
RDW: 11.7 % (ref 11.5–15.5)
WBC: 9.7 10*3/uL (ref 4.0–10.5)
nRBC: 0 % (ref 0.0–0.2)

## 2018-11-15 LAB — BASIC METABOLIC PANEL
Anion gap: 10 (ref 5–15)
BUN: 11 mg/dL (ref 6–20)
CO2: 23 mmol/L (ref 22–32)
Calcium: 8.1 mg/dL — ABNORMAL LOW (ref 8.9–10.3)
Chloride: 100 mmol/L (ref 98–111)
Creatinine, Ser: 0.71 mg/dL (ref 0.61–1.24)
GFR calc Af Amer: >60 mL/min (ref >60)
GFR calc non Af Amer: >60 mL/min (ref >60)
Glucose, Bld: 90 mg/dL (ref 70–99)
Potassium: 3.7 mmol/L (ref 3.5–5.1)
Sodium: 133 mmol/L — ABNORMAL LOW (ref 135–145)

## 2018-11-15 MED ORDER — ACETAMINOPHEN 500 MG PO TABS
1000.0000 mg | ORAL_TABLET | Freq: Four times a day (QID) | ORAL | Status: DC
  Administered 2018-11-15 – 2018-11-20 (×19): 1000 mg via ORAL
  Filled 2018-11-15 (×19): qty 2

## 2018-11-15 MED ORDER — GABAPENTIN 300 MG PO CAPS
300.0000 mg | ORAL_CAPSULE | Freq: Three times a day (TID) | ORAL | Status: DC
  Administered 2018-11-15 – 2018-11-20 (×16): 300 mg via ORAL
  Filled 2018-11-15 (×17): qty 1

## 2018-11-15 MED ORDER — POTASSIUM CHLORIDE 10 MEQ/100ML IV SOLN
10.0000 meq | INTRAVENOUS | Status: AC
  Administered 2018-11-15 (×6): 10 meq via INTRAVENOUS
  Filled 2018-11-15 (×5): qty 100

## 2018-11-15 MED ORDER — GLYCERIN (LAXATIVE) 2.1 G RE SUPP
1.0000 | Freq: Once | RECTAL | Status: AC
  Administered 2018-11-15: 1 via RECTAL
  Filled 2018-11-15: qty 1

## 2018-11-15 NOTE — Progress Notes (Signed)
At 0130 pt pulled his NG tube out. On call ( Dr. Lucia Gaskins) notified and ok to leave NG out and patient to remain NPO.

## 2018-11-15 NOTE — Progress Notes (Signed)
Patient ID: Roberto Flores, male   DOB: 01/30/1986, 33 y.o.   MRN: [id number]    3 Days Post-Op  Subjective: Patient up in a chair this morning.  Doesn't want to be.  Keeps complaining of pain.  Gets pain meds every 2 hours.  No flatus yet.  Pulled NGT out overnight.  No nausea with tube out.  Objective: Vital signs in last 24 hours: Temp:  [97.7 F (36.5 C)-98.9 F (37.2 C)] 98.1 F (36.7 C) (08/19 0755) Pulse Rate:  [57-99] 57 (08/19 0400) Resp:  [17-31] 20 (08/19 0400) BP: (144-161)/(87-106) 154/90 (08/19 0400) SpO2:  [92 %-100 %] 94 % (08/19 0400)    Intake/Output from previous day: 08/18 0701 - 08/19 0700 In: 2756.2 [I.V.:1974.2; IV Piggyback:712] Out: 2060 [Urine:1350; Drains:710] Intake/Output this shift: Total I/O In: -  Out: 400 [Urine:400]  PE: Heart: regular Lungs: pulls 1000 on IS with best try, otherwise wants to take short shallow breaths due to abdominal pain. Abd: soft, but tries to guard some with palpation, ND, some BS, incisions are stable and intact, no evidence of erythema  Lab Results:  Recent Labs    11/14/18 0223 11/15/18 0202  WBC 8.9 9.7  HGB 12.6* 12.1*  HCT 36.4* 34.8*  PLT 101* 121*   BMET Recent Labs    11/14/18 0223 11/15/18 0202  NA 133* 133*  K 3.4* 3.7  CL 101 100  CO2 27 23  GLUCOSE 100* 90  BUN 8 11  CREATININE 0.67 0.71  CALCIUM 7.8* 8.1*   PT/INR No results for input(s): LABPROT, INR in the last 72 hours. CMP     Component Value Date/Time   NA 133 (L) 11/15/2018 0202   NA 139 03/09/2012 1558   K 3.7 11/15/2018 0202   K 3.6 03/09/2012 1558   CL 100 11/15/2018 0202   CL 104 03/09/2012 1558   CO2 23 11/15/2018 0202   CO2 25 03/09/2012 1558   GLUCOSE 90 11/15/2018 0202   GLUCOSE 74 03/09/2012 1558   BUN 11 11/15/2018 0202   BUN 12 03/09/2012 1558   CREATININE 0.71 11/15/2018 0202   CREATININE 0.79 03/09/2012 1558   CALCIUM 8.1 (L) 11/15/2018 0202   CALCIUM 9.1 03/09/2012 1558   PROT 8.0 11/12/2018 0515   PROT 7.7 03/09/2012 1558   ALBUMIN 5.0 11/12/2018 0515   ALBUMIN 4.5 03/09/2012 1558   AST 44 (H) 11/12/2018 0515   AST 25 03/09/2012 1558   ALT 41 11/12/2018 0515   ALT 22 03/09/2012 1558   ALKPHOS 94 11/12/2018 0515   ALKPHOS 84 03/09/2012 1558   BILITOT 0.6 11/12/2018 0515   BILITOT 0.8 03/09/2012 1558   GFRNONAA >60 11/15/2018 0202   GFRNONAA >60 03/09/2012 1558   GFRAA >60 11/15/2018 0202   GFRAA >60 03/09/2012 1558   Lipase     Component Value Date/Time   LIPASE 23 11/12/2018 0515       Studies/Results: No results found.  Anti-infectives: Anti-infectives (From admission, onward)   Start     Dose/Rate Route Frequency Ordered Stop   11/12/18 2300  piperacillin-tazobactam (ZOSYN) IVPB 3.375 g     3.375 g 12.5 mL/hr over 240 Minutes Intravenous Every 8 hours 11/12/18 2240 11/16/18 2159   11/12/18 1230  cefTRIAXone (ROCEPHIN) 2 g in sodium chloride 0.9 % 100 mL IVPB     2 g 200 mL/hr over 30 Minutes Intravenous On call to O.R. 11/12/18 1202 11/12/18 1626   11/12/18 1230  metroNIDAZOLE (FLAGYL) IVPB 500 mg  Status:  Discontinued     500 mg 100 mL/hr over 60 Minutes Intravenous Every 8 hours 11/12/18 1202 11/12/18 2309       Assessment/Plan ETOH abuse/withdrawal - on precedex/CIWA protocol Polysubstance abuse - will be difficult to control pain Hypokalemia - replace K again today, check BMET in am  POD 3, s/p laparoscopic reduction of strangulated diaphragmatic hernia with partial gastrectomy and primary hiatal hernia repair with phasics mesh, Dr. Johney Maine 8/16  -NGt removed overnight.  Leave out.  Check UGI today.  If negative may consider some liquids -4 days of post operative abx therapy, on zosyn -protonix BID -mobilize as able  -pulm toilet -cont JP drain WBC  normalized -try to maximize multimodal pain control.  Schedule tylenol, add gabapentin, dilaudid, robaxin  FEN -NPO, IVFs VTE -Lovenox ID -zosyn 8/16 -->   LOS: 3 days    Henreitta Cea  , [facility] 11/15/2018, 8:32 AM Pager: [phone]

## 2018-11-16 LAB — BASIC METABOLIC PANEL
Anion gap: 10 (ref 5–15)
BUN: 12 mg/dL (ref 6–20)
CO2: 21 mmol/L — ABNORMAL LOW (ref 22–32)
Calcium: 7.8 mg/dL — ABNORMAL LOW (ref 8.9–10.3)
Chloride: 104 mmol/L (ref 98–111)
Creatinine, Ser: 0.63 mg/dL (ref 0.61–1.24)
GFR calc Af Amer: >60 mL/min (ref >60)
GFR calc non Af Amer: >60 mL/min (ref >60)
Glucose, Bld: 87 mg/dL (ref 70–99)
Potassium: 3.5 mmol/L (ref 3.5–5.1)
Sodium: 135 mmol/L (ref 135–145)

## 2018-11-16 MED ORDER — POTASSIUM CHLORIDE 20 MEQ/15ML (10%) PO SOLN
40.0000 meq | Freq: Two times a day (BID) | ORAL | Status: AC
  Administered 2018-11-16 (×2): 40 meq via ORAL
  Filled 2018-11-16 (×2): qty 30

## 2018-11-16 MED ORDER — SODIUM CHLORIDE 0.9 % IV SOLN
INTRAVENOUS | Status: DC | PRN
  Administered 2018-11-16: 13:00:00 500 mL via INTRAVENOUS

## 2018-11-16 MED ORDER — OXYCODONE HCL 5 MG PO TABS
5.0000 mg | ORAL_TABLET | ORAL | Status: DC | PRN
  Administered 2018-11-16 – 2018-11-18 (×10): 10 mg via ORAL
  Administered 2018-11-18: 5 mg via ORAL
  Administered 2018-11-18 – 2018-11-20 (×10): 10 mg via ORAL
  Filled 2018-11-16 (×21): qty 2

## 2018-11-16 MED ORDER — CALCIUM CARBONATE ANTACID 500 MG PO CHEW
2.0000 | CHEWABLE_TABLET | Freq: Two times a day (BID) | ORAL | Status: AC
  Administered 2018-11-16 (×2): 400 mg via ORAL
  Filled 2018-11-16 (×2): qty 2

## 2018-11-16 MED ORDER — LORAZEPAM 2 MG/ML IJ SOLN
0.0000 mg | Freq: Two times a day (BID) | INTRAMUSCULAR | Status: AC
  Administered 2018-11-16: 2 mg via INTRAVENOUS
  Administered 2018-11-17 (×2): 1 mg via INTRAVENOUS
  Filled 2018-11-16 (×4): qty 1

## 2018-11-16 MED ORDER — HYDROMORPHONE HCL 1 MG/ML IJ SOLN
0.5000 mg | INTRAMUSCULAR | Status: DC | PRN
  Administered 2018-11-16: 2 mg via INTRAVENOUS
  Administered 2018-11-16: 1 mg via INTRAVENOUS
  Administered 2018-11-16 – 2018-11-17 (×7): 2 mg via INTRAVENOUS
  Filled 2018-11-16 (×10): qty 2

## 2018-11-16 NOTE — Progress Notes (Signed)
Patient arrived to 1534 from ICU via wheelchair. Donne Hazel, RN

## 2018-11-16 NOTE — Progress Notes (Addendum)
Patient ID: Roberto Flores, male   DOB: 11-19-1985, 33 y.o.   MRN: [id number]    4 Days Post-Op  Subjective: Patient doing much better today.  Passing lots of flatus and had a large BM this morning.  No nausea.  Pain seems better.  Off precedex  Objective: Vital signs in last 24 hours: Temp:  [97.4 F (36.3 C)-98.3 F (36.8 C)] 97.8 F (36.6 C) (08/20 0400) Pulse Rate:  [48-87] 64 (08/20 0800) Resp:  [13-27] 21 (08/20 0800) BP: (129-171)/(68-99) 146/84 (08/20 0800) SpO2:  [94 %-100 %] 99 % (08/20 0800) Last BM Date: 11/15/18  Intake/Output from previous day: 08/19 0701 - 08/20 0700 In: 1854.2 [I.V.:1407.7; IV Piggyback:446.5] Out: 3419 [Urine:1100; Drains:220; Stool:2] Intake/Output this shift: Total I/O In: 900 [I.V.:900] Out: 100 [Drains:100]  PE: Heart: regular Lungs: CTAB Abd: soft, less tender, incisions are c/d/i with old drainage noted on gauze.  JP drain with just serousang output.  Great BS  Lab Results:  Recent Labs    11/14/18 0223 11/15/18 0202  WBC 8.9 9.7  HGB 12.6* 12.1*  HCT 36.4* 34.8*  PLT 101* 121*   BMET Recent Labs    11/15/18 0202 11/16/18 0619  NA 133* 135  K 3.7 3.5  CL 100 104  CO2 23 21*  GLUCOSE 90 87  BUN 11 12  CREATININE 0.71 0.63  CALCIUM 8.1* 7.8*   PT/INR No results for input(s): LABPROT, INR in the last 72 hours. CMP     Component Value Date/Time   NA 135 11/16/2018 0619   NA 139 03/09/2012 1558   K 3.5 11/16/2018 0619   K 3.6 03/09/2012 1558   CL 104 11/16/2018 0619   CL 104 03/09/2012 1558   CO2 21 (L) 11/16/2018 0619   CO2 25 03/09/2012 1558   GLUCOSE 87 11/16/2018 0619   GLUCOSE 74 03/09/2012 1558   BUN 12 11/16/2018 0619   BUN 12 03/09/2012 1558   CREATININE 0.63 11/16/2018 0619   CREATININE 0.79 03/09/2012 1558   CALCIUM 7.8 (L) 11/16/2018 0619   CALCIUM 9.1 03/09/2012 1558   PROT 8.0 11/12/2018 0515   PROT 7.7 03/09/2012 1558   ALBUMIN 5.0 11/12/2018 0515   ALBUMIN 4.5 03/09/2012 1558   AST 44 (H)  11/12/2018 0515   AST 25 03/09/2012 1558   ALT 41 11/12/2018 0515   ALT 22 03/09/2012 1558   ALKPHOS 94 11/12/2018 0515   ALKPHOS 84 03/09/2012 1558   BILITOT 0.6 11/12/2018 0515   BILITOT 0.8 03/09/2012 1558   GFRNONAA >60 11/16/2018 0619   GFRNONAA >60 03/09/2012 1558   GFRAA >60 11/16/2018 0619   GFRAA >60 03/09/2012 1558   Lipase     Component Value Date/Time   LIPASE 23 11/12/2018 0515       Studies/Results: Dg Ugi W Single Cm (sol Or Thin Ba)  Result Date: 11/15/2018 CLINICAL DATA:  Postop from surgical repair of incarcerated hiatal hernia. EXAM: WATER SOLUBLE UPPER GI SERIES TECHNIQUE: Single-column upper GI series was performed using water soluble contrast. CONTRAST:  150 mL Omnipaque 300 COMPARISON:  None. FLUOROSCOPY TIME:  Fluoroscopy Time:  1 minutes 54 seconds Radiation Exposure Index (if provided by the fluoroscopic device): 14.9 mGy Number of Acquired Spot Images: 0 FINDINGS: The scout radiograph shows diffuse gaseous distention of colon, consistent with postop ileus. There is no evidence of contrast leak or extravasation. No evidence of hiatal hernia or obstruction. Diffuse narrowing of the gastric fundus is seen, likely due to postop edema. Evaluation of  stomach limited due to incomplete distention, however there is no evidence of contrast extravasation or obstruction. Visualized portion of duodenum is unremarkable. IMPRESSION: 1. No evidence of contrast leak or obstruction, or residual hiatal hernia. 2. Diffuse narrowing of the gastric fundus, likely due to postop edema. 3. Postop ileus. Electronically Signed   By: Danae OrleansJohn A Stahl M.D.   On: 11/15/2018 11:16    Anti-infectives: Anti-infectives (From admission, onward)   Start     Dose/Rate Route Frequency Ordered Stop   11/12/18 2300  piperacillin-tazobactam (ZOSYN) IVPB 3.375 g     3.375 g 12.5 mL/hr over 240 Minutes Intravenous Every 8 hours 11/12/18 2240 11/16/18 2159   11/12/18 1230  cefTRIAXone (ROCEPHIN) 2 g in  sodium chloride 0.9 % 100 mL IVPB     2 g 200 mL/hr over 30 Minutes Intravenous On call to O.R. 11/12/18 1202 11/12/18 1626   11/12/18 1230  metroNIDAZOLE (FLAGYL) IVPB 500 mg  Status:  Discontinued     500 mg 100 mL/hr over 60 Minutes Intravenous Every 8 hours 11/12/18 1202 11/12/18 2309       Assessment/Plan ETOH abuse/withdrawal - off precedex, CIWA in place prn Polysubstance abuse - will be difficult to control pain Hypokalemia - replace K again today, check BMET in am Hypocalcemia - replace and check in am  POD4, s/p laparoscopic reduction of strangulated diaphragmatic hernia with partial gastrectomy and primary hiatal hernia repair with phasics mesh, Dr. Michaell CowingGross 8/16  -UGI negative.  CLD today -4 days of post operative abx therapy, on zosyn, stop today -protonix BID -mobilize as able  -pulm toilet -cont JP drain -try to maximize multimodal pain control.  Schedule tylenol, add gabapentin, dilaudid, robaxin  FEN -CLD/IVFs VTE -Lovenox ID -zosyn 8/16 -->8/20   LOS: 4 days    Letha CapeKelly E Maijor Hornig , [facility] 11/16/2018, 8:31 AM Pager: [phone]

## 2018-11-16 NOTE — Progress Notes (Signed)
Patient being transferred to [place] room 1534.  Report called and given to [facility].  The patient's wife, Roberto Flores was called and notified.  Patient went with belongings consisting off cell phone with charger, headphones with charger, and clothing.  Patient was medicated with pain meds and went by wheelchair.

## 2018-11-17 LAB — BASIC METABOLIC PANEL
Anion gap: 11 (ref 5–15)
BUN: <5 mg/dL — ABNORMAL LOW (ref 6–20)
CO2: 22 mmol/L (ref 22–32)
Calcium: 8.5 mg/dL — ABNORMAL LOW (ref 8.9–10.3)
Chloride: 104 mmol/L (ref 98–111)
Creatinine, Ser: 0.48 mg/dL — ABNORMAL LOW (ref 0.61–1.24)
GFR calc Af Amer: >60 mL/min (ref >60)
GFR calc non Af Amer: >60 mL/min (ref >60)
Glucose, Bld: 101 mg/dL — ABNORMAL HIGH (ref 70–99)
Potassium: 3.5 mmol/L (ref 3.5–5.1)
Sodium: 137 mmol/L (ref 135–145)

## 2018-11-17 MED ORDER — POTASSIUM CHLORIDE 20 MEQ/15ML (10%) PO SOLN
40.0000 meq | Freq: Two times a day (BID) | ORAL | Status: AC
  Administered 2018-11-17 (×2): 40 meq via ORAL
  Filled 2018-11-17 (×3): qty 30

## 2018-11-17 MED ORDER — HYDROMORPHONE HCL 1 MG/ML IJ SOLN
0.5000 mg | INTRAMUSCULAR | Status: DC | PRN
  Administered 2018-11-17: 0.5 mg via INTRAVENOUS
  Administered 2018-11-17 – 2018-11-19 (×11): 1 mg via INTRAVENOUS
  Administered 2018-11-19: 0.5 mg via INTRAVENOUS
  Administered 2018-11-19 – 2018-11-20 (×5): 1 mg via INTRAVENOUS
  Filled 2018-11-17 (×20): qty 1

## 2018-11-17 MED ORDER — METHOCARBAMOL 500 MG PO TABS
1000.0000 mg | ORAL_TABLET | Freq: Three times a day (TID) | ORAL | Status: DC
  Administered 2018-11-17 – 2018-11-20 (×9): 1000 mg via ORAL
  Filled 2018-11-17 (×11): qty 2

## 2018-11-17 MED ORDER — PANTOPRAZOLE SODIUM 40 MG PO TBEC
40.0000 mg | DELAYED_RELEASE_TABLET | Freq: Two times a day (BID) | ORAL | Status: DC
  Administered 2018-11-17 – 2018-11-20 (×7): 40 mg via ORAL
  Filled 2018-11-17 (×7): qty 1

## 2018-11-17 MED ORDER — CALCIUM CARBONATE ANTACID 500 MG PO CHEW
2.0000 | CHEWABLE_TABLET | Freq: Two times a day (BID) | ORAL | Status: AC
  Administered 2018-11-17 (×2): 400 mg via ORAL
  Filled 2018-11-17 (×2): qty 2

## 2018-11-17 NOTE — Discharge Instructions (Signed)
[facility] ° °Please arrive at least 30 min before your appointment to complete your check in paperwork.  If you are unable to arrive 30 min prior to your appointment time we may have to cancel or reschedule you. °LAPAROSCOPIC SURGERY: POST OP INSTRUCTIONS °Always review your discharge instruction sheet given to you by the facility where your surgery was performed. °IF YOU HAVE DISABILITY OR FAMILY LEAVE FORMS, YOU MUST BRING THEM TO THE OFFICE FOR PROCESSING.   °DO NOT GIVE THEM TO YOUR DOCTOR. ° °PAIN CONTROL ° °1. First take acetaminophen (Tylenol) AND/or ibuprofen (Advil) to control your pain after surgery.  Follow directions on package.  Taking acetaminophen (Tylenol) and/or ibuprofen (Advil) regularly after surgery will help to control your pain and lower the amount of prescription pain medication you may need.  You should not take more than 4,000 mg (4 grams) of acetaminophen (Tylenol) in 24 hours.  You should not take ibuprofen (Advil), aleve, motrin, naprosyn or other NSAIDS if you have a history of stomach ulcers or chronic kidney disease.  °2. A prescription for pain medication may be given to you upon discharge.  Take your pain medication as prescribed, if you still have uncontrolled pain after taking acetaminophen (Tylenol) or ibuprofen (Advil). °3. Use ice packs to help control pain. °4. If you need a refill on your pain medication, please contact your pharmacy.  They will contact our office to request authorization. Prescriptions will not be filled after 5pm or on week-ends. ° °HOME MEDICATIONS °5. Take your usually prescribed medications unless otherwise directed. ° °DIET °6. You should follow a light diet the first few days after arrival home.  Be sure to include lots of fluids daily. Avoid fatty, fried foods.  ° °CONSTIPATION °7. It is common to experience some constipation after surgery and if you are taking pain medication.  Increasing fluid intake and taking a stool  softener (such as Colace) will usually help or prevent this problem from occurring.  A mild laxative (Milk of Magnesia or Miralax) should be taken according to package instructions if there are no bowel movements after 48 hours. ° °WOUND/INCISION CARE °8. Most patients will experience some swelling and bruising in the area of the incisions.  Ice packs will help.  Swelling and bruising can take several days to resolve.  °9. Unless discharge instructions indicate otherwise, follow guidelines below  °a. STERI-STRIPS - you may remove your outer bandages 48 hours after surgery, and you may shower at that time.  You have steri-strips (small skin tapes) in place directly over the incision.  These strips should be left on the skin for 7-10 days.   °b. DERMABOND/SKIN GLUE - you may shower in 24 hours.  The glue will flake off over the next 2-3 weeks. °10. Any sutures or staples will be removed at the office during your follow-up visit. ° °ACTIVITIES °11. You may resume regular (light) daily activities beginning the next day--such as daily self-care, walking, climbing stairs--gradually increasing activities as tolerated.  You may have sexual intercourse when it is comfortable.  Refrain from any heavy lifting or straining until approved by your doctor. °a. You may drive when you are no longer taking prescription pain medication, you can comfortably wear a seatbelt, and you can safely maneuver your car and apply brakes. ° °FOLLOW-UP °12. You should see your doctor in the office for a follow-up appointment approximately 2-3 weeks after your surgery.  You should have been given your post-op/follow-up appointment when   your surgery was scheduled.  If you did not receive a post-op/follow-up appointment, make sure that you call for this appointment within a day or two after you arrive home to insure a convenient appointment time.   WHEN TO CALL YOUR DOCTOR: 1. Fever over 101.0 2. Inability to urinate 3. Continued bleeding from  incision. 4. Increased pain, redness, or drainage from the incision. 5. Increasing abdominal pain  The clinic staff is available to answer your questions during regular business hours.  Please dont hesitate to call and ask to speak to one of the nurses for clinical concerns.  If you have a medical emergency, go to the nearest emergency room or call 911.  A surgeon from [facility] is always on call at the hospital. [address], [address], [place], [place]  [id number] ? [address] [address], [place], [place]   [id number] [phone] ? [phone] ? [phone] [phone]) [phone]   [facility] 4.0   (1)  Alcoholism treatment program Open ? Closes 12AM  [phone] [facility] 3.2   (14)  Al[place] Open ? Closes 6PM  [phone] WEBSITE DIRECTIONS Alcohol and Drug Services (ADS) 4.0   (8)  Rehabilitation center [place] Open ? Closes 3PM  [phone] WEBSITE DIRECTIONS alcoholics anonymous in [place], [place] 5.0   (8)  Alcoholism treatment program [place] [phone]) [phone] WEBSITE DIRECTIONS [facility] [place] 4.4   (14)  Addiction treatment center [place] Open ? Closes 5PM  [phone]) [phone]  "[facility] is one of the best resources for parents of ..." WEBSITE DIRECTIONS Al-Anon No reviews  Alcoholism treatment program [place], [place] [phone] WEBSITE DIRECTIONS [facility] 3.6   (16)  Addiction treatment center [place] Closes soon ? 2PM  (336) [phone] WEBSITE DIRECTIONS Recovery Resources No reviews  Alcoholism treatment program [place] Closed ? Opens 8:30AM Mon  [phone] DIRECTIONS [facility] 4.4   (40)  Addiction treatment center [place], [place] Open 24 hours  [phone] WEBSITE DIRECTIONS [facility] 3.9   (45)  Mental health service [place] Open ? Closes 6PM  [phone]) [phone]  "My fiance was able to recover after 10 years of alcohol addiction." WEBSITE DIRECTIONS Alcoholics Anonymous 5.0   (2)  Alcoholism treatment program [place], [place] Closed ? Opens 5PM  [phone] WEBSITE DIRECTIONS [facility] 3.4   (10)  Mental health service [place], [place] Open 24 hours  [phone] WEBSITE DIRECTIONS [facility] 4.6   (11)  Addiction treatment center [place] Open ? Closes 5PM  [phone]  "Awesome ministry working with drug and alcohol addicted men." WEBSITE DIRECTIONS [facility] No reviews  Non-profit organization [place], [place] [phone] WEBSITE DIRECTIONS [facility] [place] 3.0   (2)  Addiction treatment center [place], [place] [phone]) [phone] WEBSITE DIRECTIONS Recovery Resources No reviews  Counselor [place], [place] Open ? Closes 8PM  (336) [phone] DIRECTIONS [facility] 4.7   (3)  Alcoholism treatment program [place], [place] [phone] DIRECTIONS [facility] 4.6   (55)  Senior citizen center [place]  In [facility] Open ? Closes 5PM  [phone] WEBSITE DIRECTIONS [facility] Services No reviews  Non-profit organization [place], [place] Open ? Closes 5PM  [phone]) [phone] WEBSITE DIRECTIONS [facility] [employer] 5.0   (1)  [facility] [place] [facility] [place], [place] Open ? Closes  5PM  [phone]

## 2018-11-17 NOTE — Anesthesia Postprocedure Evaluation (Signed)
Anesthesia Post Note  Patient: Zakaria Sedor  Procedure(s) Performed: LAPAROSCOPIC REDUCTION OF hiatel hernia repair partial gastrectomy, EGD, DIAPHRAGM AND HERNIA REPAIR (N/A )     Patient location during evaluation: PACU Anesthesia Type: General Level of consciousness: awake and alert Pain management: pain level controlled Vital Signs Assessment: post-procedure vital signs reviewed and stable Respiratory status: spontaneous breathing, nonlabored ventilation, respiratory function stable and patient connected to nasal cannula oxygen Cardiovascular status: blood pressure returned to baseline and stable Postop Assessment: no apparent nausea or vomiting Anesthetic complications: no    Last Vitals:  Vitals:   11/16/18 2031 11/17/18 0517  BP: (!) 141/84 (!) 149/92  Pulse: 90 70  Resp: 16 16  Temp: 36.9 C 37.3 C  SpO2: 98% 99%    Last Pain:  Vitals:   11/17/18 0645  TempSrc:   PainSc: 10-Worst pain ever                 Song Garris S

## 2018-11-17 NOTE — Progress Notes (Signed)
Patient refused to ambulate in hall last night, educated on purpose of ambulation (pain medication given).   Norlene Duel RN, BSN

## 2018-11-17 NOTE — TOC Progression Note (Signed)
Transition of Care [facility](TOC) - Progression Note    Patient Details  Name: Roberto Flores MRN: [id number] Date of Birth: 04/05/1985  Transition of Care [facility](TOC) CM/SW Contact  Cody Albus, Olegario MessierKathy, RN Phone Number: 11/17/2018, 1:51 PM  Clinical Narrative:    [facility] 4.0   (1)  Alcoholism treatment program Open ? Closes 12AM  [phone] [facility] 3.2   (14)  Alcoholism treatment program [place] Open ? Closes 6PM  [phone] WEBSITE DIRECTIONS Alcohol and Drug Services (ADS) 4.0   (8)  Rehabilitation center [place] Open ? Closes 3PM  [phone] WEBSITE DIRECTIONS alcoholics anonymous in [place], [place] 5.0   (8)  Alcoholism treatment program [place] [phone] WEBSITE DIRECTIONS [facility] [place] 4.4   (14)  Addiction treatment center [place] Open ? Closes 5PM  [phone]  "[facility] is one of the best resources for parents of ..." WEBSITE DIRECTIONS Al-Anon No reviews  Alcoholism treatment program [place] [phone]) [phone] WEBSITE DIRECTIONS [facility] 3.6   (16)  Addiction treatment center [place] Closes soon ? 2PM  [phone] WEBSITE DIRECTIONS Recovery Resources No reviews  Alcoholism treatment program [place] Closed ? Opens 8:30AM Mon  [phone]) [phone] DIRECTIONS [facility] 4.4   (40)  Addiction treatment center [place] Open 24 hours  [phone] WEBSITE DIRECTIONS  "My fiance was able to recover after 10 years of alcohol addiction." WEBSITE DIRECTIONS Alcoholics Anonymous 5.0   (2)  Alcoholism treatment program [place], [place] Closed ? Opens 5PM  [phone] WEBSITE DIRECTIONS   "Awesome ministry working with drug and alcohol addicted men." WEBSITE DIRECTIONS [facility] [place] No reviews  Non-profit organization [place] [phone] WEBSITE DIRECTIONS [facility] [place] 3.0   (2)  Addiction treatment center [place] [phone] WEBSITE DIRECTIONS [facility] Resources No reviews  Counselor [place] Open ? Closes 8PM  [phone]) [phone] DIRECTIONS [facility] 4.7   (3)  Alcoholism treatment program [place], [place] [phone]) [phone] DIRECTIONS [facility] [place] 4.6   (55)  Senior citizen center [place]  In [facility] Open ? Closes 5PM  [phone] WEBSITE DIRECTIONS ADS Alcohol Drug Services No reviews  Non-profit organization [place] Open ? Closes 5PM  [phone] WEBSITE DIRECTIONS [facility] [employer] [facility] 5.0   (1)  [place] government [place] Open ? Closes 5PM  [phone]) [phone]   Expected Discharge Plan: Home/Self Care Barriers to Discharge: Continued Medical Work up  Expected Discharge Plan and Services Expected Discharge Plan: Home/Self Care       Living arrangements for the past 2 months: Single Family Home Expected Discharge Date: (unknown)                                     Social Determinants of Health (SDOH) Interventions    Readmission Risk Interventions Readmission Risk Prevention Plan 11/13/2018  Transportation Screening Complete  PCP or Specialist Appt within 3-5 Days Not Complete  Not Complete comments Not ready for dc  HRI or Home Care Consult Not Complete  HRI or Home Care Consult comments NA  Social Work Consult for Recovery Care Planning/Counseling Not Complete  SW consult not completed comments Pt in SDU with NGT. Will do when more with it.  Palliative Care Screening Not Applicable  Medication Review (RN Care Manager)  Complete  Some recent data might be hidden

## 2018-11-17 NOTE — Progress Notes (Signed)
Patient ID: Roberto Flores, male   DOB: 09-27-1985, 33 y.o.   MRN: [id number]    5 Days Post-Op  Subjective: Patient continues to feel better each day.  Having some diarrhea.  Tolerating clear liquids well.  Still needing IV dilaudid for quicker pain relief, but oxy is working well.  Objective: Vital signs in last 24 hours: Temp:  [98.1 F (36.7 C)-99.1 F (37.3 C)] 99.1 F (37.3 C) (08/21 0517) Pulse Rate:  [68-90] 70 (08/21 0517) Resp:  [16-20] 16 (08/21 0517) BP: (141-178)/(74-94) 149/92 (08/21 0517) SpO2:  [96 %-99 %] 99 % (08/21 0517) Last BM Date: 11/16/18  Intake/Output from previous day: 08/20 0701 - 08/21 0700 In: 2628.2 [I.V.:2429.2; IV Piggyback:199] Out: 1000 [Urine:300; Drains:400; Stool:300] Intake/Output this shift: No intake/output data recorded.  PE: Abd: soft, less tender, but still appropriate, +BS, ND, incisions c/d/i, drain with serosang output.  Lab Results:  Recent Labs    11/15/18 0202  WBC 9.7  HGB 12.1*  HCT 34.8*  PLT 121*   BMET Recent Labs    11/16/18 0619 11/17/18 0324  NA 135 137  K 3.5 3.5  CL 104 104  CO2 21* 22  GLUCOSE 87 101*  BUN 12 <5*  CREATININE 0.63 0.48*  CALCIUM 7.8* 8.5*   PT/INR No results for input(s): LABPROT, INR in the last 72 hours. CMP     Component Value Date/Time   NA 137 11/17/2018 0324   NA 139 03/09/2012 1558   K 3.5 11/17/2018 0324   K 3.6 03/09/2012 1558   CL 104 11/17/2018 0324   CL 104 03/09/2012 1558   CO2 22 11/17/2018 0324   CO2 25 03/09/2012 1558   GLUCOSE 101 (H) 11/17/2018 0324   GLUCOSE 74 03/09/2012 1558   BUN <5 (L) 11/17/2018 0324   BUN 12 03/09/2012 1558   CREATININE 0.48 (L) 11/17/2018 0324   CREATININE 0.79 03/09/2012 1558   CALCIUM 8.5 (L) 11/17/2018 0324   CALCIUM 9.1 03/09/2012 1558   PROT 8.0 11/12/2018 0515   PROT 7.7 03/09/2012 1558   ALBUMIN 5.0 11/12/2018 0515   ALBUMIN 4.5 03/09/2012 1558   AST 44 (H) 11/12/2018 0515   AST 25 03/09/2012 1558   ALT 41 11/12/2018  0515   ALT 22 03/09/2012 1558   ALKPHOS 94 11/12/2018 0515   ALKPHOS 84 03/09/2012 1558   BILITOT 0.6 11/12/2018 0515   BILITOT 0.8 03/09/2012 1558   GFRNONAA >60 11/17/2018 0324   GFRNONAA >60 03/09/2012 1558   GFRAA >60 11/17/2018 0324   GFRAA >60 03/09/2012 1558   Lipase     Component Value Date/Time   LIPASE 23 11/12/2018 0515       Studies/Results: Dg Ugi W Single Cm (sol Or Thin Ba)  Result Date: 11/15/2018 CLINICAL DATA:  Postop from surgical repair of incarcerated hiatal hernia. EXAM: WATER SOLUBLE UPPER GI SERIES TECHNIQUE: Single-column upper GI series was performed using water soluble contrast. CONTRAST:  150 mL Omnipaque 300 COMPARISON:  None. FLUOROSCOPY TIME:  Fluoroscopy Time:  1 minutes 54 seconds Radiation Exposure Index (if provided by the fluoroscopic device): 14.9 mGy Number of Acquired Spot Images: 0 FINDINGS: The scout radiograph shows diffuse gaseous distention of colon, consistent with postop ileus. There is no evidence of contrast leak or extravasation. No evidence of hiatal hernia or obstruction. Diffuse narrowing of the gastric fundus is seen, likely due to postop edema. Evaluation of stomach limited due to incomplete distention, however there is no evidence of contrast extravasation or obstruction. Visualized  portion of duodenum is unremarkable. IMPRESSION: 1. No evidence of contrast leak or obstruction, or residual hiatal hernia. 2. Diffuse narrowing of the gastric fundus, likely due to postop edema. 3. Postop ileus. Electronically Signed   By: Danae OrleansJohn A Stahl M.D.   On: 11/15/2018 11:16    Anti-infectives: Anti-infectives (From admission, onward)   Start     Dose/Rate Route Frequency Ordered Stop   11/12/18 2300  piperacillin-tazobactam (ZOSYN) IVPB 3.375 g     3.375 g 12.5 mL/hr over 240 Minutes Intravenous Every 8 hours 11/12/18 2240 11/16/18 1716   11/12/18 1230  cefTRIAXone (ROCEPHIN) 2 g in sodium chloride 0.9 % 100 mL IVPB     2 g 200 mL/hr over 30  Minutes Intravenous On call to O.R. 11/12/18 1202 11/12/18 1626   11/12/18 1230  metroNIDAZOLE (FLAGYL) IVPB 500 mg  Status:  Discontinued     500 mg 100 mL/hr over 60 Minutes Intravenous Every 8 hours 11/12/18 1202 11/12/18 2309       Assessment/Plan ETOH abuse/withdrawal - off precedex, CIWA in place prn Polysubstance abuse - will be difficult to control pain Hypokalemia - 3.5, replace again today.  May be staying low despite replacement due to diarrhea. Hypocalcemia - improved  POD5, s/p laparoscopic reduction of strangulated diaphragmatic hernia with partial gastrectomy and primary hiatal hernia repair with phasics mesh, Dr. Michaell CowingGross 8/16 -UGI negative.  FLD today -zosyn stopped 8/20 -protonix BID -mobilize as able  -pulm toilet -cont JP drain, but this can likely be DC soon despite higher output given it's just serosang. -wean dilaudid as he is working on oxy with tylenol, robaxin, gabapentin, etc  FEN -FLD/SLIV VTE -Lovenox ID -zosyn 8/16 -->8/20   LOS: 5 days    Letha CapeKelly E Latrelle Bazar , [facility] 11/17/2018, 9:33 AM Pager: [phone]

## 2018-11-18 ENCOUNTER — Encounter (HOSPITAL_COMMUNITY): Payer: Self-pay

## 2018-11-18 ENCOUNTER — Inpatient Hospital Stay (HOSPITAL_COMMUNITY): Payer: BLUE CROSS/BLUE SHIELD

## 2018-11-18 LAB — BASIC METABOLIC PANEL
Anion gap: 11 (ref 5–15)
BUN: <5 mg/dL — ABNORMAL LOW (ref 6–20)
CO2: 23 mmol/L (ref 22–32)
Calcium: 9.2 mg/dL (ref 8.9–10.3)
Chloride: 106 mmol/L (ref 98–111)
Creatinine, Ser: 0.54 mg/dL — ABNORMAL LOW (ref 0.61–1.24)
GFR calc Af Amer: >60 mL/min (ref >60)
GFR calc non Af Amer: >60 mL/min (ref >60)
Glucose, Bld: 100 mg/dL — ABNORMAL HIGH (ref 70–99)
Potassium: 3.6 mmol/L (ref 3.5–5.1)
Sodium: 140 mmol/L (ref 135–145)

## 2018-11-18 MED ORDER — SODIUM CHLORIDE (PF) 0.9 % IJ SOLN
INTRAMUSCULAR | Status: AC
  Filled 2018-11-18: qty 50

## 2018-11-18 MED ORDER — IOHEXOL 350 MG/ML SOLN
100.0000 mL | Freq: Once | INTRAVENOUS | Status: AC | PRN
  Administered 2018-11-18: 100 mL via INTRAVENOUS

## 2018-11-18 NOTE — Progress Notes (Signed)
Patient ID: Roberto Flores, male   DOB: 06/17/1985, 33 y.o.   MRN: [id number]    6 Days Post-Op  Subjective: Patient continues to feel better each day.  Tolerating full liquids well.  Still needing IV dilaudid for quicker pain relief, but oxy is working well.  Objective: Vital signs in last 24 hours: Temp:  [98.1 F (36.7 C)-98.7 F (37.1 C)] 98.1 F (36.7 C) (08/22 0543) Pulse Rate:  [61-84] 61 (08/22 0543) Resp:  [16-21] 21 (08/22 0543) BP: (139-152)/(81-100) 152/89 (08/22 0543) SpO2:  [99 %-100 %] 100 % (08/22 0543) Last BM Date: 11/16/18  Intake/Output from previous day: 08/21 0701 - 08/22 0700 In: 1200 [P.O.:1200] Out: 710 [Urine:600; Drains:110] Intake/Output this shift: No intake/output data recorded.  PE: Abd: soft, less tender, but still appropriate, +BS, ND, incisions c/d/i, drain with serosang output.  Lab Results:  No results for input(s): WBC, HGB, HCT, PLT in the last 72 hours. BMET Recent Labs    11/17/18 0324 11/18/18 0329  NA 137 140  K 3.5 3.6  CL 104 106  CO2 22 23  GLUCOSE 101* 100*  BUN <5* <5*  CREATININE 0.48* 0.54*  CALCIUM 8.5* 9.2   PT/INR No results for input(s): LABPROT, INR in the last 72 hours. CMP     Component Value Date/Time   NA 140 11/18/2018 0329   NA 139 03/09/2012 1558   K 3.6 11/18/2018 0329   K 3.6 03/09/2012 1558   CL 106 11/18/2018 0329   CL 104 03/09/2012 1558   CO2 23 11/18/2018 0329   CO2 25 03/09/2012 1558   GLUCOSE 100 (H) 11/18/2018 0329   GLUCOSE 74 03/09/2012 1558   BUN <5 (L) 11/18/2018 0329   BUN 12 03/09/2012 1558   CREATININE 0.54 (L) 11/18/2018 0329   CREATININE 0.79 03/09/2012 1558   CALCIUM 9.2 11/18/2018 0329   CALCIUM 9.1 03/09/2012 1558   PROT 8.0 11/12/2018 0515   PROT 7.7 03/09/2012 1558   ALBUMIN 5.0 11/12/2018 0515   ALBUMIN 4.5 03/09/2012 1558   AST 44 (H) 11/12/2018 0515   AST 25 03/09/2012 1558   ALT 41 11/12/2018 0515   ALT 22 03/09/2012 1558   ALKPHOS 94 11/12/2018 0515   ALKPHOS 84 03/09/2012 1558   BILITOT 0.6 11/12/2018 0515   BILITOT 0.8 03/09/2012 1558   GFRNONAA >60 11/18/2018 0329   GFRNONAA >60 03/09/2012 1558   GFRAA >60 11/18/2018 0329   GFRAA >60 03/09/2012 1558   Lipase     Component Value Date/Time   LIPASE 23 11/12/2018 0515       Studies/Results: No results found.  Anti-infectives: Anti-infectives (From admission, onward)   Start     Dose/Rate Route Frequency Ordered Stop   11/12/18 2300  piperacillin-tazobactam (ZOSYN) IVPB 3.375 g     3.375 g 12.5 mL/hr over 240 Minutes Intravenous Every 8 hours 11/12/18 2240 11/16/18 1716   11/12/18 1230  cefTRIAXone (ROCEPHIN) 2 g in sodium chloride 0.9 % 100 mL IVPB     2 g 200 mL/hr over 30 Minutes Intravenous On call to O.R. 11/12/18 1202 11/12/18 1626   11/12/18 1230  metroNIDAZOLE (FLAGYL) IVPB 500 mg  Status:  Discontinued     500 mg 100 mL/hr over 60 Minutes Intravenous Every 8 hours 11/12/18 1202 11/12/18 2309       Assessment/Plan ETOH abuse/withdrawal - off precedex, CIWA in place prn Polysubstance abuse - will be difficult to control pain Hypokalemia - 3.6 today.   Hypocalcemia - improved  POD6,  s/p laparoscopic reduction of strangulated diaphragmatic hernia with partial gastrectomy and primary hiatal hernia repair with phasics mesh, Dr. Johney Maine 8/16 -UGI negative.  Soft diet today -zosyn stopped 8/20 -protonix BID -mobilize as able  -pulm toilet -cont JP drain, but this can likely be DC soon despite higher output given it's just serosang. -wean dilaudid as he is working on oxy with tylenol, robaxin, gabapentin, etc  FEN -FLD/SLIV VTE -Lovenox ID -zosyn 8/16 -->8/20   LOS: 6 days     Rosario Adie, MD  [facility]

## 2018-11-19 LAB — CBC
HCT: 42.9 % (ref 39.0–52.0)
Hemoglobin: 14.6 g/dL (ref 13.0–17.0)
MCH: 35.2 pg — ABNORMAL HIGH (ref 26.0–34.0)
MCHC: 34 g/dL (ref 30.0–36.0)
MCV: 103.4 fL — ABNORMAL HIGH (ref 80.0–100.0)
Platelets: 394 10*3/uL (ref 150–400)
RBC: 4.15 MIL/uL — ABNORMAL LOW (ref 4.22–5.81)
RDW: 12.1 % (ref 11.5–15.5)
WBC: 9.5 10*3/uL (ref 4.0–10.5)
nRBC: 0 % (ref 0.0–0.2)

## 2018-11-19 NOTE — Progress Notes (Addendum)
Patient ID: Roberto Flores, male   DOB: 1985/07/14, 33 y.o.   MRN: [id number]    7 Days Post-Op  Subjective: Patient continues to feel better.  Tolerating soft diet.  Still needing IV dilaudid for quicker pain relief.  Had some shoulder pain.  Got CT PE which was neg but did have some RLL infiltrate.  Denies cough or fevers  Objective: Vital signs in last 24 hours: Temp:  [97.8 F (36.6 C)-97.9 F (36.6 C)] 97.9 F (36.6 C) (08/23 0526) Pulse Rate:  [55-88] 55 (08/23 0526) Resp:  [16] 16 (08/23 0526) BP: (120-124)/(76-85) 120/76 (08/23 0526) SpO2:  [99 %-100 %] 99 % (08/23 0526) Last BM Date: 11/18/18  Intake/Output from previous day: 08/22 0701 - 08/23 0700 In: 744.7 [P.O.:240; I.V.:504.7] Out: 450 [Urine:400; Drains:50] Intake/Output this shift: No intake/output data recorded.  PE: Abd: soft, less tender, but still appropriate, +BS, ND, incisions c/d/i, drain with serosang output. Chest: CTA Lab Results:  No results for input(s): WBC, HGB, HCT, PLT in the last 72 hours. BMET Recent Labs    11/17/18 0324 11/18/18 0329  NA 137 140  K 3.5 3.6  CL 104 106  CO2 22 23  GLUCOSE 101* 100*  BUN <5* <5*  CREATININE 0.48* 0.54*  CALCIUM 8.5* 9.2   PT/INR No results for input(s): LABPROT, INR in the last 72 hours. CMP     Component Value Date/Time   NA 140 11/18/2018 0329   NA 139 03/09/2012 1558   K 3.6 11/18/2018 0329   K 3.6 03/09/2012 1558   CL 106 11/18/2018 0329   CL 104 03/09/2012 1558   CO2 23 11/18/2018 0329   CO2 25 03/09/2012 1558   GLUCOSE 100 (H) 11/18/2018 0329   GLUCOSE 74 03/09/2012 1558   BUN <5 (L) 11/18/2018 0329   BUN 12 03/09/2012 1558   CREATININE 0.54 (L) 11/18/2018 0329   CREATININE 0.79 03/09/2012 1558   CALCIUM 9.2 11/18/2018 0329   CALCIUM 9.1 03/09/2012 1558   PROT 8.0 11/12/2018 0515   PROT 7.7 03/09/2012 1558   ALBUMIN 5.0 11/12/2018 0515   ALBUMIN 4.5 03/09/2012 1558   AST 44 (H) 11/12/2018 0515   AST 25 03/09/2012 1558   ALT  41 11/12/2018 0515   ALT 22 03/09/2012 1558   ALKPHOS 94 11/12/2018 0515   ALKPHOS 84 03/09/2012 1558   BILITOT 0.6 11/12/2018 0515   BILITOT 0.8 03/09/2012 1558   GFRNONAA >60 11/18/2018 0329   GFRNONAA >60 03/09/2012 1558   GFRAA >60 11/18/2018 0329   GFRAA >60 03/09/2012 1558   Lipase     Component Value Date/Time   LIPASE 23 11/12/2018 0515       Studies/Results: Ct Angio Chest Pe W Or Wo Contrast  Result Date: 11/18/2018 CLINICAL DATA:  Back pain, recent gastrectomy EXAM: CT ANGIOGRAPHY CHEST WITH CONTRAST TECHNIQUE: Multidetector CT imaging of the chest was performed using the standard protocol during bolus administration of intravenous contrast. Multiplanar CT image reconstructions and MIPs were obtained to evaluate the vascular anatomy. CONTRAST:  175mL OMNIPAQUE IOHEXOL 350 MG/ML SOLN COMPARISON:  CT chest, 11/12/2018 FINDINGS: Cardiovascular: Examination for pulmonary embolism is significantly limited by contrast bolus and motion artifact. Within this limitation, there is no obvious central or lobar embolus. Examination is particularly very limited in the right lung. Normal heart size. No pericardial effusion. Mediastinum/Nodes: No enlarged mediastinal, hilar, or axillary lymph nodes. Thyroid gland, trachea, and esophagus demonstrate no significant findings. Lungs/Pleura: There is clustered heterogeneous and ground-glass opacity of  the dependent right lower lobe (series 4, image 70). Dependent atelectasis or consolidation of the left lower lobe with a left-sided chest tube/surgical drain in position. No significant pneumothorax. Upper Abdomen: No acute abnormality. Musculoskeletal: No chest wall abnormality. No acute or significant osseous findings. Review of the MIP images confirms the above findings. IMPRESSION: 1. Examination for pulmonary embolism is significantly limited by contrast bolus and motion artifact. Within this limitation, there is no obvious central or lobar embolus.  Examination is particularly very limited in the right lung. Consider technical repeat examination if there is high persistent clinical concern for pulmonary embolism. 2. There is clustered heterogeneous and ground-glass opacity of the dependent right lower lobe (series 4, image 70), consistent with infection or aspiration. 3. Dependent atelectasis or consolidation of the left lower lobe with a left-sided chest tube/surgical drain in position. No significant pneumothorax. Electronically Signed   By: Lauralyn PrimesAlex  Bibbey M.D.   On: 11/18/2018 17:34    Anti-infectives: Anti-infectives (From admission, onward)   Start     Dose/Rate Route Frequency Ordered Stop   11/12/18 2300  piperacillin-tazobactam (ZOSYN) IVPB 3.375 g     3.375 g 12.5 mL/hr over 240 Minutes Intravenous Every 8 hours 11/12/18 2240 11/16/18 1716   11/12/18 1230  cefTRIAXone (ROCEPHIN) 2 g in sodium chloride 0.9 % 100 mL IVPB     2 g 200 mL/hr over 30 Minutes Intravenous On call to O.R. 11/12/18 1202 11/12/18 1626   11/12/18 1230  metroNIDAZOLE (FLAGYL) IVPB 500 mg  Status:  Discontinued     500 mg 100 mL/hr over 60 Minutes Intravenous Every 8 hours 11/12/18 1202 11/12/18 2309       Assessment/Plan ETOH abuse/withdrawal - off precedex, CIWA in place prn Polysubstance abuse - will be difficult to control pain Hypokalemia - imrpoved.   Hypocalcemia - improved  POD7, s/p laparoscopic reduction of strangulated diaphragmatic hernia with partial gastrectomy and primary hiatal hernia repair with phasics mesh, Dr. Michaell CowingGross 8/16 -UGI negative.  Soft diet  -zosyn stopped 8/20 CBC normal.  Given no fevers or cough, I do not think CT shows a bacterial infection and is most likely pneumonitis due to aspiration. Supportive therapy only with incentive spirometry -protonix BID -mobilize -pulm toilet -d/c JP drain -wean dilaudid as he is working on oxy with tylenol, robaxin, gabapentin, etc  FEN -FLD/SLIV VTE -Lovenox ID -zosyn 8/16  -->8/20   LOS: 7 days     Vanita PandaAlicia C Reilynn Lauro, MD  Colorectal and General Surgery [facility]

## 2018-11-19 NOTE — Plan of Care (Signed)
Patient struggling with pain management and anxiety.

## 2018-11-20 MED ORDER — OXYCODONE HCL 5 MG PO TABS: 5.0000 mg | ORAL_TABLET | Freq: Four times a day (QID) | ORAL | 0 refills | Status: DC | PRN

## 2018-11-20 NOTE — Progress Notes (Signed)
8 Days Post-Op   Subjective/Chief Complaint: Feels better today. Less pain. Wants to go home   Objective: Vital signs in last 24 hours: Temp:  [98 F (36.7 C)-98.6 F (37 C)] 98 F (36.7 C) (08/24 0537) Pulse Rate:  [56-90] 56 (08/24 0537) Resp:  [16-18] 16 (08/24 0537) BP: (115-121)/(68-77) 121/68 (08/24 0537) SpO2:  [99 %-100 %] 99 % (08/23 2124) Last BM Date: 11/20/18  Intake/Output from previous day: 08/23 0701 - 08/24 0700 In: 960 [P.O.:960] Out: 1045 [Urine:1000; Drains:45] Intake/Output this shift: No intake/output data recorded.  General appearance: alert and cooperative Resp: clear to auscultation bilaterally Cardio: regular rate and rhythm GI: soft. mild tenderness. incisions look good  Lab Results:  Recent Labs    11/19/18 0844  WBC 9.5  HGB 14.6  HCT 42.9  PLT 394   BMET Recent Labs    11/18/18 0329  NA 140  K 3.6  CL 106  CO2 23  GLUCOSE 100*  BUN <5*  CREATININE 0.54*  CALCIUM 9.2   PT/INR No results for input(s): LABPROT, INR in the last 72 hours. ABG No results for input(s): PHART, HCO3 in the last 72 hours.  Invalid input(s): PCO2, PO2  Studies/Results: Ct Angio Chest Pe W Or Wo Contrast  Result Date: 11/18/2018 CLINICAL DATA:  Back pain, recent gastrectomy EXAM: CT ANGIOGRAPHY CHEST WITH CONTRAST TECHNIQUE: Multidetector CT imaging of the chest was performed using the standard protocol during bolus administration of intravenous contrast. Multiplanar CT image reconstructions and MIPs were obtained to evaluate the vascular anatomy. CONTRAST:  100mL OMNIPAQUE IOHEXOL 350 MG/ML SOLN COMPARISON:  CT chest, 11/12/2018 FINDINGS: Cardiovascular: Examination for pulmonary embolism is significantly limited by contrast bolus and motion artifact. Within this limitation, there is no obvious central or lobar embolus. Examination is particularly very limited in the right lung. Normal heart size. No pericardial effusion. Mediastinum/Nodes: No enlarged  mediastinal, hilar, or axillary lymph nodes. Thyroid gland, trachea, and esophagus demonstrate no significant findings. Lungs/Pleura: There is clustered heterogeneous and ground-glass opacity of the dependent right lower lobe (series 4, image 70). Dependent atelectasis or consolidation of the left lower lobe with a left-sided chest tube/surgical drain in position. No significant pneumothorax. Upper Abdomen: No acute abnormality. Musculoskeletal: No chest wall abnormality. No acute or significant osseous findings. Review of the MIP images confirms the above findings. IMPRESSION: 1. Examination for pulmonary embolism is significantly limited by contrast bolus and motion artifact. Within this limitation, there is no obvious central or lobar embolus. Examination is particularly very limited in the right lung. Consider technical repeat examination if there is high persistent clinical concern for pulmonary embolism. 2. There is clustered heterogeneous and ground-glass opacity of the dependent right lower lobe (series 4, image 70), consistent with infection or aspiration. 3. Dependent atelectasis or consolidation of the left lower lobe with a left-sided chest tube/surgical drain in position. No significant pneumothorax. Electronically Signed   By: Lauralyn PrimesAlex  Bibbey M.D.   On: 11/18/2018 17:34    Anti-infectives: Anti-infectives (From admission, onward)   Start     Dose/Rate Route Frequency Ordered Stop   11/12/18 2300  piperacillin-tazobactam (ZOSYN) IVPB 3.375 g     3.375 g 12.5 mL/hr over 240 Minutes Intravenous Every 8 hours 11/12/18 2240 11/16/18 1716   11/12/18 1230  cefTRIAXone (ROCEPHIN) 2 g in sodium chloride 0.9 % 100 mL IVPB     2 g 200 mL/hr over 30 Minutes Intravenous On call to O.R. 11/12/18 1202 11/12/18 1626   11/12/18 1230  metroNIDAZOLE (  FLAGYL) IVPB 500 mg  Status:  Discontinued     500 mg 100 mL/hr over 60 Minutes Intravenous Every 8 hours 11/12/18 1202 11/12/18 2309       Assessment/Plan: s/p Procedure(s): LAPAROSCOPIC REDUCTION OF hiatel hernia repair partial gastrectomy, EGD, DIAPHRAGM AND HERNIA REPAIR (N/A) Advance diet Discharge  LOS: 8 days    Autumn Messing III 11/20/2018

## 2018-11-20 NOTE — Progress Notes (Signed)
Discharge instructions given to patient. Patient had no questions. NT or writer will wheel patient out once his family comes in

## 2018-11-22 NOTE — Discharge Summary (Signed)
Physician Discharge Summary  Patient ID: Roberto Flores MRN: [id number] DOB/AGE: 30-May-1985 33 y.o.  Admit date: 11/12/2018 Discharge date: 11/20/2018  Admission Diagnoses:  Strangulated diaphragmatic hernia Alcohol abuse Polysubstance use   Discharge Diagnoses:  Strangulated diaphragmatic hernia containing necrotic stomach, hiatal hernia, fatty steatosis, accessory spleen Alcohol withdrawal -Precedex/CIWA protocol Hypokalemia Hypocalcemia Pain control      Principal Problem:   Diaphragmatic hernia stangulating stomach s/p repair & partial gastrectomy 11/12/2018 Active Problems:   Alcohol abuse   Episodic cannabis use   Nausea and vomiting   Polysubstance abuse (HCC)   History of methylenedioxymethamphetamine (MDMA) "Molly" use   Acute generalized peritonitis (HCC)   Chest pain, non-cardiac   Status post laparoscopic fundoplication   PROCEDURES: Scopic reduction of strangulated diaphragmatic hernia, partial gastrectomy, EGD, primary hiatal hernia repair 11/12/2018 Dr. Kalman Jewels Course: Young male with polysubstance abuse including tobacco, marijuana, alcohol, Molly, fentanyl.  Worsening nausea vomiting abdominal pain.  Went to [facility].  Suspect gastritis versus related to an alcohol binge episode.  Rehydrated and went home.  However he had worsening nausea and vomiting.  Decided to go [facility].  Pain seem more intense.  Work-up negative for any myocardial infarction.  CT scan done.  Shows a knuckle of sidewall of stomach going up into the left chest.  Concerning for paraesophageal versus diaphragmatic hernia.  Poor perfusion concerning for ischemia.  Given significant pain.  Surgical consultation requested.  Evaluated by Dr. Lovell Sheehan up there.  Patient hemodynamically stable but outside his comfort level of management.  Transfer requested [place].  My partner Dr Corliss Skains requested I help evaluate given my expertise in foregut and minimally base of  surgery.  Therefore sent to [facility]  Patient notes that he had nausea and vomiting after drinking a sixpack of beer last night.  It persisted and concerned him.  Sharp upper abdominal left-sided chest wall pain.  He denies really any history of heartburn or reflux.  No history of heavy nonsteroidal use or ulcers.  He does have a history of polysubstance abuse he denies any use of anything beyond alcohol and marijuana in the past few days.  He is never had any abdominal surgery.  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  No history of exertional chest pain.  No family history of early cardiac disease that he is aware of.  No personal nor family history of GI/colon cancer, inflammatory bowel disease, irritable bowel syndrome, allergy such as Celiac Sprue, dietary/dairy problems, colitis, ulcers nor gastritis.  No recent sick contacts/gastroenteritis.  No travel outside the country.  No changes in diet.  No dysphagia to solids or liquids.  No significant heartburn or reflux.  No hematochezia, hematemesis, coffee ground emesis.  No evidence of prior gastric/peptic ulceration.   No exertional chest/neck/shoulder/arm pain  Pt was seen and evaluated in the ED and taken to the operating room from the emergency department. He underwent the procedure as described above.  Patient tolerated procedure well was taken to the ICU postoperatively.  He was placed on Precedex and CIWA protocol for his alcohol withdrawal.  Because of his confusion he had to be placed in restraints initially these were weaned off over the next 24 hours.  His bowel function returned and he was started on clear liquids.  His diet was advanced he was mobilized and transferred out of the ICU.  Pain control remains a primary issue.  He made good progress and was ready for discharge on 11/20/2018.  He was seen by Dr. Carolynne Edouardoth at that time.  I did not see the patient during his hospitalization discharge summaries taken from the  notes. Pathology:Stomach, resection - PORTION OF STOMACH (21 CM) SHOWING HEMORRHAGE WITH ASSOCIATED NECROSIS AND SEROSAL ADHESIONS  CBC Latest Ref Rng & Units 11/19/2018 11/15/2018 11/14/2018  WBC 4.0 - 10.5 K/uL 9.5 9.7 8.9  Hemoglobin 13.0 - 17.0 g/dL 40.914.6 12.1(L) 12.6(L)  Hematocrit 39.0 - 52.0 % 42.9 34.8(L) 36.4(L)  Platelets 150 - 400 K/uL 394 121(L) 101(L)   CMP Latest Ref Rng & Units 11/18/2018 11/17/2018 11/16/2018  Glucose 70 - 99 mg/dL 811(B100(H) 147(W101(H) 87  BUN 6 - 20 mg/dL <2(N<5(L) <5(A<5(L) 12  Creatinine 0.61 - 1.24 mg/dL 2.13(Y0.54(L) 8.65(H0.48(L) 8.460.63  Sodium 135 - 145 mmol/L 140 137 135  Potassium 3.5 - 5.1 mmol/L 3.6 3.5 3.5  Chloride 98 - 111 mmol/L 106 104 104  CO2 22 - 32 mmol/L 23 22 21(L)  Calcium 8.9 - 10.3 mg/dL 9.2 9.6(E8.5(L) 7.8(L)  Total Protein 6.5 - 8.1 g/dL - - -  Total Bilirubin 0.3 - 1.2 mg/dL - - -  Alkaline Phos 38 - 126 U/L - - -  AST 15 - 41 U/L - - -  ALT 0 - 44 U/L - - -   Condition on discharge: Improving    Disposition: : Home  Discharge Instructions    Call MD for:  difficulty breathing, headache or visual disturbances   Complete by: As directed    Call MD for:  extreme fatigue   Complete by: As directed    Call MD for:  hives   Complete by: As directed    Call MD for:  persistant dizziness or light-headedness   Complete by: As directed    Call MD for:  persistant nausea and vomiting   Complete by: As directed    Call MD for:  redness, tenderness, or signs of infection (pain, swelling, redness, odor or green/yellow discharge around incision site)   Complete by: As directed    Call MD for:  severe uncontrolled pain   Complete by: As directed    Call MD for:  temperature >100.4   Complete by: As directed    Diet - low sodium heart healthy   Complete by: As directed    Discharge instructions   Complete by: As directed    May shower. No heavy lifting. Eat several small meals a day   Increase activity slowly   Complete by: As directed    No wound care    Complete by: As directed      Allergies as of 11/20/2018   No Known Allergies     Medication List    STOP taking these medications   ibuprofen 600 MG tablet Commonly known as: ADVIL     TAKE these medications   dicyclomine 20 MG tablet Commonly known as: BENTYL Take 1 tablet (20 mg total) by mouth 2 (two) times daily.   lidocaine 5 % Commonly known as: Lidoderm Place 1 patch onto the skin daily. Remove & Discard patch within 12 hours or as directed by MD   methocarbamol 500 MG tablet Commonly known as: ROBAXIN Take 1 tablet (500 mg total) by mouth every 8 (eight) hours as needed for muscle spasms.   omeprazole 20 MG capsule Commonly known as: PRILOSEC Take 1 capsule (20 mg total) by mouth daily.   ondansetron 4 MG disintegrating tablet Commonly known as: Zofran ODT Take 1 tablet (4 mg total) by mouth  every 8 (eight) hours as needed for nausea or vomiting.   oxyCODONE 5 MG immediate release tablet Commonly known as: Oxy IR/ROXICODONE Take 1-2 tablets (5-10 mg total) by mouth every 6 (six) hours as needed for moderate pain.   tobramycin 0.3 % ophthalmic solution Commonly known as: Tobrex Place 2 drops into the left eye every 4 (four) hours.      Follow-up Information    Michael Boston, MD Follow up on 12/11/2018.   Specialty: General Surgery Why:  12/11/18 arrive at 9:30 to see Dr Johney Maine for 10:00appt.  Contact information: [address] [place] [address] [phone]           Signed: Earnstine Regal 11/22/2018, 3:43 PM

## 2018-11-23 DIAGNOSIS — R6889 Other general symptoms and signs: Secondary | ICD-10-CM | POA: Diagnosis not present

## 2019-06-13 DIAGNOSIS — Z23 Encounter for immunization: Secondary | ICD-10-CM | POA: Diagnosis not present

## 2019-08-06 ENCOUNTER — Encounter: Payer: Self-pay | Admitting: Nurse Practitioner

## 2019-08-09 ENCOUNTER — Other Ambulatory Visit (INDEPENDENT_AMBULATORY_CARE_PROVIDER_SITE_OTHER): Payer: BLUE CROSS/BLUE SHIELD

## 2019-08-09 ENCOUNTER — Ambulatory Visit (INDEPENDENT_AMBULATORY_CARE_PROVIDER_SITE_OTHER): Payer: BLUE CROSS/BLUE SHIELD | Admitting: Nurse Practitioner

## 2019-08-09 ENCOUNTER — Encounter: Payer: Self-pay | Admitting: Nurse Practitioner

## 2019-08-09 VITALS — BP 120/60 | HR 82 | Temp 98.9°F | Ht 73.0 in | Wt 151.4 lb

## 2019-08-09 DIAGNOSIS — R1013 Epigastric pain: Secondary | ICD-10-CM

## 2019-08-09 DIAGNOSIS — R1012 Left upper quadrant pain: Secondary | ICD-10-CM

## 2019-08-09 LAB — CBC WITH DIFFERENTIAL/PLATELET
Basophils Absolute: 0 10*3/uL (ref 0.0–0.1)
Basophils Relative: 0.9 % (ref 0.0–3.0)
Eosinophils Absolute: 0.2 10*3/uL (ref 0.0–0.7)
Eosinophils Relative: 3.4 % (ref 0.0–5.0)
HCT: 41.5 % (ref 39.0–52.0)
Hemoglobin: 14.4 g/dL (ref 13.0–17.0)
Lymphocytes Relative: 28.4 % (ref 12.0–46.0)
Lymphs Abs: 1.6 10*3/uL (ref 0.7–4.0)
MCHC: 34.7 g/dL (ref 30.0–36.0)
MCV: 97.8 fl (ref 78.0–100.0)
Monocytes Absolute: 0.5 10*3/uL (ref 0.1–1.0)
Monocytes Relative: 8.8 % (ref 3.0–12.0)
Neutro Abs: 3.4 10*3/uL (ref 1.4–7.7)
Neutrophils Relative %: 58.5 % (ref 43.0–77.0)
Platelets: 247 10*3/uL (ref 150.0–400.0)
RBC: 4.24 Mil/uL (ref 4.22–5.81)
RDW: 13.3 % (ref 11.5–15.5)
WBC: 5.7 10*3/uL (ref 4.0–10.5)

## 2019-08-09 LAB — C-REACTIVE PROTEIN: CRP: <1 mg/dL (ref 0.5–20.0)

## 2019-08-09 MED ORDER — OMEPRAZOLE 20 MG PO CPDR: 20.0000 mg | DELAYED_RELEASE_CAPSULE | Freq: Every day | ORAL | 1 refills | Status: AC

## 2019-08-09 NOTE — Patient Instructions (Signed)
If you are age 32 or older, your body mass index should be between 23-30. Your Body mass index is 19.97 kg/m. If this is out of the aforementioned range listed, please consider follow up with your Primary Care Provider.  If you are age 41 or younger, your body mass index should be between 19-25. Your Body mass index is 19.97 kg/m. If this is out of the aformentioned range listed, please consider follow up with your Primary Care Provider.   It has been recommended to you by your physician that you have a(n) Endoscopy completed. Per your request, we did not schedule the procedure(s) today. Please contact our office at [phone] should you decide to have the procedure completed.  We have sent the following medications to your pharmacy for you to pick up at your convenience:  Omeprazole 20mg  daily  Your provider has requested that you go to the basement level for lab work before leaving today. Press "B" on the elevator. The lab is located at the first door on the left as you exit the elevator.   Gastroesophageal Reflux Disease, Adult Gastroesophageal reflux (GER) happens when acid from the stomach flows up into the tube that connects the mouth and the stomach (esophagus). Normally, food travels down the esophagus and stays in the stomach to be digested. With GER, food and stomach acid sometimes move back up into the esophagus. You may have a disease called gastroesophageal reflux disease (GERD) if the reflux:  Happens often.  Causes frequent or very bad symptoms.  Causes problems such as damage to the esophagus. When this happens, the esophagus becomes sore and swollen (inflamed). Over time, GERD can make small holes (ulcers) in the lining of the esophagus. What are the causes? This condition is caused by a problem with the muscle between the esophagus and the stomach. When this muscle is weak or not normal, it does not close properly to keep food and acid from coming back up from the  stomach. The muscle can be weak because of:  Tobacco use.  Pregnancy.  Having a certain type of hernia (hiatal hernia).  Alcohol use.  Certain foods and drinks, such as coffee, chocolate, onions, and peppermint. What increases the risk? You are more likely to develop this condition if you:  Are overweight.  Have a disease that affects your connective tissue.  Use NSAID medicines. What are the signs or symptoms? Symptoms of this condition include:  Heartburn.  Difficult or painful swallowing.  The feeling of having a lump in the throat.  A bitter taste in the mouth.  Bad breath.  Having a lot of saliva.  Having an upset or bloated stomach.  Belching.  Chest pain. Different conditions can cause chest pain. Make sure you see your doctor if you have chest pain.  Shortness of breath or noisy breathing (wheezing).  Ongoing (chronic) cough or a cough at night.  Wearing away of the surface of teeth (tooth enamel).  Weight loss. How is this treated? Treatment will depend on how bad your symptoms are. Your doctor may suggest:  Changes to your diet.  Medicine.  Surgery. Follow these instructions at home: Eating and drinking   Follow a diet as told by your doctor. You may need to avoid foods and drinks such as: ? Coffee and tea (with or without caffeine). ? Drinks that contain alcohol. ? Energy drinks and sports drinks. ? Bubbly (carbonated) drinks or sodas. ? Chocolate and cocoa. ? Peppermint and mint flavorings. ? Garlic and  onions. ? Horseradish. ? Spicy and acidic foods. These include peppers, chili powder, curry powder, vinegar, hot sauces, and BBQ sauce. ? Citrus fruit juices and citrus fruits, such as oranges, lemons, and limes. ? Tomato-based foods. These include red sauce, chili, salsa, and pizza with red sauce. ? Fried and fatty foods. These include donuts, french fries, potato chips, and high-fat dressings. ? High-fat meats. These include hot  dogs, rib eye steak, sausage, ham, and bacon. ? High-fat dairy items, such as whole milk, butter, and cream cheese.  Eat small meals often. Avoid eating large meals.  Avoid drinking large amounts of liquid with your meals.  Avoid eating meals during the 2-3 hours before bedtime.  Avoid lying down right after you eat.  Do not exercise right after you eat. Lifestyle   Do not use any products that contain nicotine or tobacco. These include cigarettes, e-cigarettes, and chewing tobacco. If you need help quitting, ask your doctor.  Try to lower your stress. If you need help doing this, ask your doctor.  If you are overweight, lose an amount of weight that is healthy for you. Ask your doctor about a safe weight loss goal. General instructions  Pay attention to any changes in your symptoms.  Take over-the-counter and prescription medicines only as told by your doctor. Do not take aspirin, ibuprofen, or other NSAIDs unless your doctor says it is okay.  Wear loose clothes. Do not wear anything tight around your waist.  Raise (elevate) the head of your bed about 6 inches (15 cm).  Avoid bending over if this makes your symptoms worse.  Keep all follow-up visits as told by your doctor. This is important. Contact a doctor if:  You have new symptoms.  You lose weight and you do not know why.  You have trouble swallowing or it hurts to swallow.  You have wheezing or a cough that keeps happening.  Your symptoms do not get better with treatment.  You have a hoarse voice. Get help right away if:  You have pain in your arms, neck, jaw, teeth, or back.  You feel sweaty, dizzy, or light-headed.  You have chest pain or shortness of breath.  You throw up (vomit) and your throw-up looks like blood or coffee grounds.  You pass out (faint).  Your poop (stool) is bloody or black.  You cannot swallow, drink, or eat. Summary  If a person has gastroesophageal reflux disease (GERD),  food and stomach acid move back up into the esophagus and cause symptoms or problems such as damage to the esophagus.  Treatment will depend on how bad your symptoms are.  Follow a diet as told by your doctor.  Take all medicines only as told by your doctor. This information is not intended to replace advice given to you by your health care provider. Make sure you discuss any questions you have with your health care provider. Document Revised: 09/21/2017 Document Reviewed: 09/21/2017 Elsevier Patient Education  2020 [place] for Gastroesophageal Reflux Disease, Adult When you have gastroesophageal reflux disease (GERD), the foods you eat and your eating habits are very important. Choosing the right foods can help ease your discomfort. Think about working with a nutrition specialist (dietitian) to help you make good choices. What are tips for following this plan?  Meals  Choose healthy foods that are low in fat, such as fruits, vegetables, whole grains, low-fat dairy products, and lean meat, fish, and poultry.  Eat small meals often instead  of 3 large meals a day. Eat your meals slowly, and in a place where you are relaxed. Avoid bending over or lying down until 2-3 hours after eating.  Avoid eating meals 2-3 hours before bed.  Avoid drinking a lot of liquid with meals.  Cook foods using methods other than frying. Bake, grill, or broil food instead.  Avoid or limit: ? Chocolate. ? Peppermint or spearmint. ? Alcohol. ? Pepper. ? Black and decaffeinated coffee. ? Black and decaffeinated tea. ? Bubbly (carbonated) soft drinks. ? Caffeinated energy drinks and soft drinks.  Limit high-fat foods such as: ? Fatty meat or fried foods. ? Whole milk, cream, butter, or ice cream. ? Nuts and nut butters. ? Pastries, donuts, and sweets made with butter or shortening.  Avoid foods that cause symptoms. These foods may be different for everyone. Common foods that cause  symptoms include: ? Tomatoes. ? Oranges, lemons, and limes. ? Peppers. ? Spicy food. ? Onions and garlic. ? Vinegar. Lifestyle  Maintain a healthy weight. Ask your doctor what weight is healthy for you. If you need to lose weight, work with your doctor to do so safely.  Exercise for at least 30 minutes for 5 or more days each week, or as told by your doctor.  Wear loose-fitting clothes.  Do not smoke. If you need help quitting, ask your doctor.  Sleep with the head of your bed higher than your feet. Use a wedge under the mattress or blocks under the bed frame to raise the head of the bed. Summary  When you have gastroesophageal reflux disease (GERD), food and lifestyle choices are very important in easing your symptoms.  Eat small meals often instead of 3 large meals a day. Eat your meals slowly, and in a place where you are relaxed.  Limit high-fat foods such as fatty meat or fried foods.  Avoid bending over or lying down until 2-3 hours after eating.  Avoid peppermint and spearmint, caffeine, alcohol, and chocolate. This information is not intended to replace advice given to you by your health care provider. Make sure you discuss any questions you have with your health care provider. Document Revised: 07/06/2018 Document Reviewed: 04/20/2016 Elsevier Patient Education  2020 [employer].

## 2019-08-09 NOTE — Progress Notes (Signed)
08/09/2019 Roberto Flores [id number] 06/24/1985   CHIEF COMPLAINT: stomach pain   HISTORY OF PRESENT ILLNESS:  Roberto Flores is a 34 year old male with a past medical history of alcoholism (last alcohol intake was 4 months ago) opioid dependence (drug free since 10/2017).  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 He is S/P laparoscopic nissen fundoplication 11/12/2018 by Dr. Karie Soda.  He presents today as recommended by his surgeon Dr. Michaell Cowing for further evaluation regarding stomach pain and a change in his bowel pattern consisting of coffee-ground type stool.  He ate spicy [place] food on Friday 08/03/2019.  Developed stomach discomfort and indigestion and took 2 Pepto-Bismol tablets later that evening.  Sunday 5/9 he awakened in the morning and drank 1 cup of coffee and orange juice.  He then developed a hot wave that passed through his stomach with associated heartburn and sweats which lasted for 2 to 3 hours.  He felt significantly fatigued at that time as well.  Later in the day, he developed a second episode of stomach burning with nausea and dry heaves which lasted for about 1 hour.  No vomiting.  He passed a dark coffee ground type bowel movement x1.  He had a third episode on Monday 5/10 while he was at work.  He described feeling nauseous with a burning sensation throughout his abdomen but mostly to his epigastric area with heartburn.  He took Pepto-Bismol and the abdominal burning discomfort significantly reduced within 30 minutes then lingered for the next 2 to 3 hours.  No further coffee-ground type stools.  Reported passing loose brown stool for the next 2 days.  Today he passed a fully formed brown stool.  No black stool or rectal bleeding.  He feels a bit queasy today but no further vomiting.  He is having mild epigastric discomfort out severe pain at this  time.  No NSAID use.  He takes Tylenol PM at bedtime.  No weight loss.  He continues to feel fatigued.  He reports eating 3 meals daily.  He does not skip meals.  He drinks 1 cup of coffee.  No family history of esophageal, gastric or colorectal cancer. He was prescribed Omeprazole 20 mg twice daily by Dr. Michaell Cowing, however, he has not taken this medication.  He eported receiving a COVID-19 vaccination March and April 2021.  Past Medical History:  Diagnosis Date  . Alcohol dependence (HCC)   . Opioid dependence (HCC) 10/2018   claims he went through rehab for Fentanyl and no longer uses   Past Surgical History:  Procedure Laterality Date  . LAPAROSCOPIC NISSEN FUNDOPLICATION N/A 11/12/2018   Procedure: LAPAROSCOPIC REDUCTION OF hiatel hernia repair partial gastrectomy, EGD, DIAPHRAGM AND HERNIA REPAIR;  Surgeon: Karie Soda, MD;  Location: [facility];  Service: General;  Laterality: N/A;    Social History: Married. Twin boys age 95  Smokes cigarettes 1 pppd x  11 years. History of alcohol abuse, last drank alcohol 4 months ago.  No drugs 10/2017.   Family History: Mother age 86 skin cancer. Father age 14 alcoholism and drug addiction.     Outpatient Encounter Medications as of 08/09/2019  Medication Sig  . omeprazole (PRILOSEC) 20 MG capsule Take 1 capsule (20 mg total) by mouth daily.  . [DISCONTINUED] dicyclomine (BENTYL) 20 MG tablet Take 1 tablet (20 mg total) by mouth 2 (two) times daily.  . [  DISCONTINUED] lidocaine (LIDODERM) 5 % Place 1 patch onto the skin daily. Remove & Discard patch within 12 hours or as directed by MD (Patient not taking: Reported on 11/11/2018)  . [DISCONTINUED] methocarbamol (ROBAXIN) 500 MG tablet Take 1 tablet (500 mg total) by mouth every 8 (eight) hours as needed for muscle spasms. (Patient not taking: Reported on 11/11/2018)  . [DISCONTINUED] omeprazole (PRILOSEC) 20 MG capsule Take 1 capsule (20 mg total) by mouth daily.  . [DISCONTINUED] ondansetron (ZOFRAN ODT) 4  MG disintegrating tablet Take 1 tablet (4 mg total) by mouth every 8 (eight) hours as needed for nausea or vomiting.  . [DISCONTINUED] oxyCODONE (OXY IR/ROXICODONE) 5 MG immediate release tablet Take 1-2 tablets (5-10 mg total) by mouth every 6 (six) hours as needed for moderate pain.  . [DISCONTINUED] tobramycin (TOBREX) 0.3 % ophthalmic solution Place 2 drops into the left eye every 4 (four) hours. (Patient not taking: Reported on 01/27/2018)   No facility-administered encounter medications on file as of 08/09/2019.     REVIEW OF SYSTEMS: All other systems reviewed and negative except where noted in the History of Present Illness.   PHYSICAL EXAM: BP 120/60   Pulse 82   Temp 98.9 F (37.2 C)   Ht 6\' 1"  (1.854 m)   Wt 151 lb 6 oz (68.7 kg)   BMI 19.97 kg/m  General: Thin 34 year old male in no acute distress. Head: Normocephalic and atraumatic. Eyes:  Sclerae non-icteric, conjunctive pink. Ears: Normal auditory acuity. Mouth: Dentition intact. No ulcers or lesions.  Neck: Supple, no lymphadenopathy or thyromegaly.  Lungs: Clear bilaterally to auscultation without wheezes, crackles or rhonchi. Heart: Regular rate and rhythm. No murmur, rub or gallop appreciated.  Abdomen: Soft, non distended.  Mild epigastric and left upper quadrant tenderness without rebound or guarding.  No masses. No hepatosplenomegaly. Normoactive bowel sounds x 4 quadrants.  Rectal: Deferred. Musculoskeletal: Symmetrical with no gross deformities. Skin: Warm and dry. No rash or lesions on visible extremities. Extremities: No edema. Neurological: Alert oriented x 4, no focal deficits.  Psychological:  Alert and cooperative. Normal mood and affect.  ASSESSMENT AND PLAN:  31.  34 year old male s/p  laparoscopic nissen fundoplication due to having a strangulated diaphragmatic hernia containing necrotic stomach 11/12/2018 presents with epigastric and left upper quadrant abdominal pain with one episode of  coffee-ground stool. -CBC, CMP and CRP -Heme slides  -EGD benefits and risks discussed including risk with sedation, risk of bleeding, perforation and infection  -Omeprazole 20 mg 1 p.o. daily -GERD/ulcer handout -Patient to call our office if his symptoms worsen -Further follow-up to be determined after the above evaluation completed  2. Diarrhea, resolving  -Patient to call our office if diarrhea recurs   3. Alcoholism, no alcohol x 4 months. Hx of polysubstance abuse, drug free since 10/2017         CC:  Michael Boston, MD

## 2019-08-12 NOTE — Progress Notes (Signed)
Attending Physician's Attestation   I have reviewed the chart.   I agree with the Advanced Practitioner's note, impression, and recommendations with any updates as below. Diagnostic evaluation endoscopically seems very reasonable.   Corliss Parish, MD [facility]oscopy Office # [phone]

## 2019-08-16 ENCOUNTER — Telehealth: Payer: Self-pay | Admitting: General Surgery

## 2019-08-16 DIAGNOSIS — R1013 Epigastric pain: Secondary | ICD-10-CM

## 2019-08-16 DIAGNOSIS — R1012 Left upper quadrant pain: Secondary | ICD-10-CM

## 2019-08-16 NOTE — Telephone Encounter (Signed)
-----   Message from Arnaldo Natal, NP sent at 08/16/2019  8:08 AM EDT ----- Mayo Ao, I ordered a CMP which was not done day of his lab draw? Can you check on why this was not done. If patient is willing please send him to lab to have CMP done.  Thx  ----- Message ----- From: SYSTEM Sent: 08/14/2019  12:07 AM EDT To: Arnaldo Natal, NP

## 2019-08-16 NOTE — Telephone Encounter (Signed)
Patient states he had his labs drawn the day he was here.  Called [facility] and spoke with [facility]. They did not draw the cmp because the order was not futured. Unable to add on the test because they do not have the proper tube.  Called the patient back and I apologized, I put the order in incorrectly, it was not placed as future, therefore the lab did not see the order to draw the CMP.The patient will come in tomorrow to [facility] lab to have his CMP drawn. Patient was kind and understanding.

## 2019-08-24 ENCOUNTER — Other Ambulatory Visit (INDEPENDENT_AMBULATORY_CARE_PROVIDER_SITE_OTHER): Payer: BLUE CROSS/BLUE SHIELD

## 2019-08-24 DIAGNOSIS — R1012 Left upper quadrant pain: Secondary | ICD-10-CM | POA: Diagnosis not present

## 2019-08-24 DIAGNOSIS — R1013 Epigastric pain: Secondary | ICD-10-CM | POA: Diagnosis not present

## 2019-08-24 LAB — COMPREHENSIVE METABOLIC PANEL
ALT: 17 U/L (ref 0–53)
AST: 23 U/L (ref 0–37)
Albumin: 4.8 g/dL (ref 3.5–5.2)
Alkaline Phosphatase: 57 U/L (ref 39–117)
BUN: 11 mg/dL (ref 6–23)
CO2: 25 mEq/L (ref 19–32)
Calcium: 9.3 mg/dL (ref 8.4–10.5)
Chloride: 103 mEq/L (ref 96–112)
Creatinine, Ser: 0.84 mg/dL (ref 0.40–1.50)
GFR: 105.74 mL/min (ref >60.00)
Glucose, Bld: 101 mg/dL — ABNORMAL HIGH (ref 70–99)
Potassium: 3.6 mEq/L (ref 3.5–5.1)
Sodium: 139 mEq/L (ref 135–145)
Total Bilirubin: 0.4 mg/dL (ref 0.2–1.2)
Total Protein: 7 g/dL (ref 6.0–8.3)

## 2019-08-31 NOTE — Telephone Encounter (Signed)
Pt had labs drawn 08-24-2019

## 2019-10-16 DIAGNOSIS — H6090 Unspecified otitis externa, unspecified ear: Secondary | ICD-10-CM | POA: Diagnosis not present

## 2019-10-16 DIAGNOSIS — H6120 Impacted cerumen, unspecified ear: Secondary | ICD-10-CM | POA: Diagnosis not present

## 2019-10-16 DIAGNOSIS — R6889 Other general symptoms and signs: Secondary | ICD-10-CM | POA: Diagnosis not present

## 2019-10-22 ENCOUNTER — Emergency Department: Payer: BLUE CROSS/BLUE SHIELD

## 2019-10-22 ENCOUNTER — Other Ambulatory Visit: Payer: Self-pay

## 2019-10-22 ENCOUNTER — Encounter: Payer: Self-pay | Admitting: Emergency Medicine

## 2019-10-22 ENCOUNTER — Emergency Department
Admission: EM | Admit: 2019-10-22 | Discharge: 2019-10-22 | Disposition: A | Discharge status: Home / Self Care | Payer: BLUE CROSS/BLUE SHIELD | Attending: Emergency Medicine | Admitting: Emergency Medicine

## 2019-10-22 DIAGNOSIS — K529 Noninfective gastroenteritis and colitis, unspecified: Secondary | ICD-10-CM | POA: Insufficient documentation

## 2019-10-22 DIAGNOSIS — R112 Nausea with vomiting, unspecified: Secondary | ICD-10-CM | POA: Insufficient documentation

## 2019-10-22 DIAGNOSIS — F17210 Nicotine dependence, cigarettes, uncomplicated: Secondary | ICD-10-CM | POA: Insufficient documentation

## 2019-10-22 DIAGNOSIS — R198 Other specified symptoms and signs involving the digestive system and abdomen: Secondary | ICD-10-CM | POA: Diagnosis not present

## 2019-10-22 DIAGNOSIS — R110 Nausea: Secondary | ICD-10-CM | POA: Diagnosis not present

## 2019-10-22 DIAGNOSIS — R197 Diarrhea, unspecified: Secondary | ICD-10-CM | POA: Diagnosis not present

## 2019-10-22 DIAGNOSIS — Z8719 Personal history of other diseases of the digestive system: Secondary | ICD-10-CM | POA: Diagnosis not present

## 2019-10-22 DIAGNOSIS — Z9889 Other specified postprocedural states: Secondary | ICD-10-CM

## 2019-10-22 DIAGNOSIS — R109 Unspecified abdominal pain: Secondary | ICD-10-CM | POA: Diagnosis not present

## 2019-10-22 DIAGNOSIS — Z808 Family history of malignant neoplasm of other organs or systems: Secondary | ICD-10-CM | POA: Insufficient documentation

## 2019-10-22 DIAGNOSIS — A09 Infectious gastroenteritis and colitis, unspecified: Secondary | ICD-10-CM

## 2019-10-22 DIAGNOSIS — K828 Other specified diseases of gallbladder: Secondary | ICD-10-CM | POA: Diagnosis not present

## 2019-10-22 DIAGNOSIS — K6389 Other specified diseases of intestine: Secondary | ICD-10-CM | POA: Diagnosis not present

## 2019-10-22 LAB — CBC
HCT: 39.8 % (ref 39.0–52.0)
Hemoglobin: 14.8 g/dL (ref 13.0–17.0)
MCH: 33.8 pg (ref 26.0–34.0)
MCHC: 37.2 g/dL — ABNORMAL HIGH (ref 30.0–36.0)
MCV: 90.9 fL (ref 80.0–100.0)
Platelets: 258 10*3/uL (ref 150–400)
RBC: 4.38 MIL/uL (ref 4.22–5.81)
RDW: 11.9 % (ref 11.5–15.5)
WBC: 7 10*3/uL (ref 4.0–10.5)
nRBC: 0 % (ref 0.0–0.2)

## 2019-10-22 LAB — COMPREHENSIVE METABOLIC PANEL
ALT: 15 U/L (ref 0–44)
AST: 17 U/L (ref 15–41)
Albumin: 3.9 g/dL (ref 3.5–5.0)
Alkaline Phosphatase: 72 U/L (ref 38–126)
Anion gap: 10 (ref 5–15)
BUN: <5 mg/dL — ABNORMAL LOW (ref 6–20)
CO2: 28 mmol/L (ref 22–32)
Calcium: 8.9 mg/dL (ref 8.9–10.3)
Chloride: 100 mmol/L (ref 98–111)
Creatinine, Ser: 0.74 mg/dL (ref 0.61–1.24)
GFR calc Af Amer: >60 mL/min (ref >60)
GFR calc non Af Amer: >60 mL/min (ref >60)
Glucose, Bld: 124 mg/dL — ABNORMAL HIGH (ref 70–99)
Potassium: 3.7 mmol/L (ref 3.5–5.1)
Sodium: 138 mmol/L (ref 135–145)
Total Bilirubin: 0.6 mg/dL (ref 0.3–1.2)
Total Protein: 7 g/dL (ref 6.5–8.1)

## 2019-10-22 LAB — URINALYSIS, COMPLETE (UACMP) WITH MICROSCOPIC
Bacteria, UA: NONE SEEN
Bilirubin Urine: NEGATIVE
Glucose, UA: NEGATIVE mg/dL
Hgb urine dipstick: NEGATIVE
Ketones, ur: 5 mg/dL — AB
Leukocytes,Ua: NEGATIVE
Nitrite: NEGATIVE
Protein, ur: NEGATIVE mg/dL
Specific Gravity, Urine: 1.011 (ref 1.005–1.030)
Squamous Epithelial / HPF: NONE SEEN (ref 0–5)
pH: 6 (ref 5.0–8.0)

## 2019-10-22 LAB — LACTIC ACID, PLASMA: Lactic Acid, Venous: 1.7 mmol/L (ref 0.5–1.9)

## 2019-10-22 LAB — LIPASE, BLOOD: Lipase: 22 U/L (ref 11–51)

## 2019-10-22 MED ORDER — ONDANSETRON HCL 4 MG/2ML IJ SOLN
4.0000 mg | Freq: Once | INTRAMUSCULAR | Status: AC
  Administered 2019-10-22: 4 mg via INTRAVENOUS
  Filled 2019-10-22: qty 2

## 2019-10-22 MED ORDER — LACTATED RINGERS IV BOLUS
1000.0000 mL | Freq: Once | INTRAVENOUS | Status: AC
  Administered 2019-10-22: 1000 mL via INTRAVENOUS

## 2019-10-22 MED ORDER — PANTOPRAZOLE SODIUM 40 MG IV SOLR
40.0000 mg | Freq: Once | INTRAVENOUS | Status: AC
  Administered 2019-10-22: 40 mg via INTRAVENOUS
  Filled 2019-10-22: qty 40

## 2019-10-22 MED ORDER — MORPHINE SULFATE (PF) 4 MG/ML IV SOLN
4.0000 mg | Freq: Once | INTRAVENOUS | Status: AC
  Administered 2019-10-22: 4 mg via INTRAVENOUS
  Filled 2019-10-22: qty 1

## 2019-10-22 MED ORDER — IOHEXOL 300 MG/ML  SOLN
100.0000 mL | Freq: Once | INTRAMUSCULAR | Status: AC | PRN
  Administered 2019-10-22: 100 mL via INTRAVENOUS
  Filled 2019-10-22: qty 100

## 2019-10-22 NOTE — ED Notes (Signed)
Patient transported to CT 

## 2019-10-22 NOTE — ED Notes (Signed)
See triage note   Presents with fever which started about 9 days ago  States temp was up to 102 at home  But is currently afebrile on arrival Also has had abd pain  Describes as "burning"  Has had n/v/d  Last time vomited was last pm  But has had diarrhea thur the night

## 2019-10-22 NOTE — ED Provider Notes (Signed)
[facility] Emergency Department Provider Note ____________________________________________   First MD Initiated Contact with Patient 10/22/19 1204     (approximate)  I have reviewed the triage vital signs and the nursing notes.  HISTORY  Chief Complaint Abdominal Pain, Nausea, Emesis, Diarrhea, and Fever   HPI Roberto Flores is a 34 y.o. male presents to the ED for evaluation of abdominal pain, nausea and vomiting.    History of alcohol abuse and previous opiate abuse.  Reports greater than 1 year since his last drink, and continued compliance with his sobriety. Chart review indicates history of emergent Nissen fundoplication in 10/2018 with this some necrotic stomach tissue.  Patient reports 7-9 days of feeling "rundown," low-grade fevers of 100-101 F, poor p.o. intake, nausea without vomiting and watery diarrhea.  He reports some cramping abdominal discomfort throughout his abdomen and sensation of bloating.  He reports concern about his previous stomach surgery the possibility of failure with his pain and discomfort.  Patient denies cough, shortness of breath, syncope, headache, chest pain or flank pain.  Past Medical History:  Diagnosis Date  . Alcohol dependence (HCC)   . Opioid dependence (HCC) 10/2018   claims he went through rehab for Fentanyl and no longer uses    Patient Active Problem List   Diagnosis Date Noted  . Abdominal pain, epigastric 08/09/2019  . Diaphragmatic hernia stangulating stomach s/p repair & partial gastrectomy 11/12/2018 11/12/2018  . Alcohol abuse 11/12/2018  . Episodic cannabis use 11/12/2018  . Nausea and vomiting 11/12/2018  . Polysubstance abuse (HCC) 11/12/2018  . History of methylenedioxymethamphetamine (MDMA) "Molly" use 11/12/2018  . Acute generalized peritonitis (HCC) 11/12/2018  . Chest pain, non-cardiac 11/12/2018  . Status post laparoscopic fundoplication 11/12/2018    Past Surgical History:  Procedure  Laterality Date  . LAPAROSCOPIC NISSEN FUNDOPLICATION N/A 11/12/2018   Procedure: LAPAROSCOPIC REDUCTION OF hiatel hernia repair partial gastrectomy, EGD, DIAPHRAGM AND HERNIA REPAIR;  Surgeon: Karie Soda, MD;  Location: [facility] ORS;  Service: General;  Laterality: N/A;    Prior to Admission medications   Medication Sig Start Date End Date Taking? Authorizing Provider  omeprazole (PRILOSEC) 20 MG capsule Take 1 capsule (20 mg total) by mouth daily. 08/09/19   Arnaldo Natal, NP    Allergies Patient has no known allergies.  Family History  Problem Relation Age of Onset  . Dementia Maternal Grandfather   . Skin cancer Maternal Aunt   . Colon cancer Neg Hx   . Stomach cancer Neg Hx   . Pancreatic cancer Neg Hx   . Rectal cancer Neg Hx   . Esophageal cancer Neg Hx     Social History Social History   Tobacco Use  . Smoking status: Current Every Day Smoker    Packs/day: 1.50    Types: Cigarettes  . Smokeless tobacco: Never Used  Vaping Use  . Vaping Use: Never used  Substance Use Topics  . Alcohol use: Not Currently  . Drug use: Not Currently    Review of Systems  Constitutional: Positive for fevers, chills and decreased activity level Eyes: No visual changes. ENT: No sore throat. Cardiovascular: Denies chest pain. Respiratory: Denies shortness of breath. Gastrointestinal: no vomiting.  No constipation. Positive for abdominal pain, nausea and diarrhea Genitourinary: Negative for dysuria. Musculoskeletal: Negative for back pain. Skin: Negative for rash. Neurological: Negative for headaches, focal weakness or numbness.  ____________________________________________   PHYSICAL EXAM:  VITAL SIGNS: ED Triage Vitals  Enc Vitals Group     BP  10/22/19 1139 119/74     Pulse Rate 10/22/19 1139 70     Resp 10/22/19 1139 17     Temp 10/22/19 1139 98.1 F (36.7 C)     Temp Source 10/22/19 1139 Oral     SpO2 10/22/19 1139 100 %     Weight 10/22/19 1131 155 lb  (70.3 kg)     Height 10/22/19 1131 6\' 4"  (1.93 m)     Head Circumference --      Peak Flow --      Pain Score 10/22/19 1131 7     Pain Loc --      Pain Edu? --      Excl. in GC? --     Constitutional: Alert and oriented. Well appearing and in no acute distress.   Eyes: Conjunctivae are normal. PERRL. EOMI. Head: Atraumatic. Nose: No congestion/rhinnorhea. Mouth/Throat: Mucous membranes are moist.  Oropharynx non-erythematous. Neck: No stridor. No cervical spine tenderness to palpation. Cardiovascular: Normal rate, regular rhythm. Grossly normal heart sounds.  Good peripheral circulation. Respiratory: Normal respiratory effort.  No retractions. Lungs CTAB. Gastrointestinal: Soft , nondistended. No abdominal bruits. No CVA tenderness. Diffuse and mild abdominal tenderness to palpation without peritoneal or further localizing features.  No CVA tenderness bilaterally.  Well-healed laparoscopic surgical incisions within the upper abdomen. Musculoskeletal: No lower extremity tenderness nor edema.  No joint effusions. No signs of acute trauma. Neurologic:  Normal speech and language. No gross focal neurologic deficits are appreciated. No gait instability noted. Skin:  Skin is warm, dry and intact. No rash noted. Psychiatric: Mood and affect are normal. Speech and behavior are normal.  ____________________________________________   LABS (all labs ordered are listed, but only abnormal results are displayed)  Labs Reviewed  COMPREHENSIVE METABOLIC PANEL - Abnormal; Notable for the following components:      Result Value   Glucose, Bld 124 (*)    BUN <5 (*)    All other components within normal limits  CBC - Abnormal; Notable for the following components:   MCHC 37.2 (*)    All other components within normal limits  URINALYSIS, COMPLETE (UACMP) WITH MICROSCOPIC - Abnormal; Notable for the following components:   Color, Urine YELLOW (*)    APPearance CLEAR (*)    Ketones, ur 5 (*)     All other components within normal limits  LIPASE, BLOOD  LACTIC ACID, PLASMA   ____________________________________________  12 Lead EKG   ____________________________________________  RADIOLOGY  ED MD interpretation:    Official radiology report(s): CT ABDOMEN PELVIS W CONTRAST  Result Date: 10/22/2019 CLINICAL DATA:  Nausea, vomiting and diarrhea for 9 days with intermittent fever. EXAM: CT ABDOMEN AND PELVIS WITH CONTRAST TECHNIQUE: Multidetector CT imaging of the abdomen and pelvis was performed using the standard protocol following bolus administration of intravenous contrast. CONTRAST:  10/24/2019 OMNIPAQUE IOHEXOL 300 MG/ML  SOLN COMPARISON:  CT scan 11/12/2018 FINDINGS: Lower chest: The lung bases are clear of acute process. No pleural effusion or pulmonary lesions. The heart is normal in size. No pericardial effusion. The distal esophagus and aorta are unremarkable. Hepatobiliary: No hepatic lesions or intrahepatic biliary dilatation. The gallbladder is moderately distended measuring up to 4.4 cm in the transverse dimension. No gallbladder wall thickening or obvious gallstones. Normal caliber common bile duct. Pancreas: No mass, inflammation or ductal dilatation. Spleen: Normal size.  No focal lesions. Adrenals/Urinary Tract: The adrenal glands and kidneys are normal. The bladder is normal. Stomach/Bowel: Surgical changes from a prior Nissen fundoplication. No findings  for recurrent hiatal hernia or other complicating features. The small bowel and colon are grossly normal without oral contrast. There are fluid-filled small bowel loops and possible mild wall thickening but no distension or air-fluid levels to suggest obstruction. The terminal ileum and appendix appear normal. Vascular/Lymphatic: The aorta is normal in caliber. No dissection. The branch vessels are patent. The major venous structures are patent. No mesenteric or retroperitoneal mass or adenopathy. Small scattered lymph nodes are  noted. Reproductive: The prostate gland and seminal vesicles are unremarkable. Other: There is a small amount of free abdominal and pelvic fluid which could raise the possibility of gastroenteritis. Musculoskeletal: No significant bony findings. IMPRESSION: 1. Surgical changes from a prior Nissen fundoplication. No findings for recurrent hiatal hernia or other complicating features. 2. Fluid-filled small bowel loops and possible mild wall thickening but no distension or air-fluid levels to suggest obstruction. Possible gastroenteritis with associated small amount of free abdominal and free pelvic fluid. 3. Moderate distention of the gallbladder. Electronically Signed   By: Rudie Meyer M.D.   On: 10/22/2019 13:10    ____________________________________________   PROCEDURES  Procedure(s) performed (including Critical Care):  Procedures   ____________________________________________   INITIAL IMPRESSION / ASSESSMENT AND PLAN / ED COURSE  34 year old male status post Nissen fundoplication presents with a week of various symptoms, most consistent with a viral gastroenteritis, and amenable to outpatient management.  Normal vital signs on room air.  Exam with diffuse and mild abdominal tenderness without peritoneal features, in a well-appearing patient.  Reassuring blood work without evidence of acute derangements.  Due to his postoperative status, CT obtained and without evidence of complications associated with his fundoplication, or evidence of SBO.  Most consistent clinical picture with viral gastroenteritis, considering his various secondary symptoms.  Fluid resuscitated and provided antiemetics with good control of his symptoms, subsequently tolerating p.o. intake.  Advised patient follow-up with his PCP within the next 5-7 days.  Return precautions for the ED were discussed.  Clinical Course as of Oct 22 1750  Mon Oct 22, 2019  1344 Reassessed.  Patient reports improving symptoms.  Educated  patient on work-up consistent with possible viral gastroenteritis.  We discussed return precautions and PCP follow-up.  Patient is eager for discharge, expresses understanding and agreement with plan.   [DS]    Clinical Course User Index [DS] Delton Prairie, MD     ____________________________________________   FINAL CLINICAL IMPRESSION(S) / ED DIAGNOSES  Final diagnoses:  Gastroenteritis  Nausea  Diarrhea of infectious origin  History of Nissen fundoplication     ED Discharge Orders    None       Codi Kertz   Note:  This document was prepared using Dragon voice recognition software and may include unintentional dictation errors.   Delton Prairie, MD 10/22/19 Windy Fast

## 2019-10-22 NOTE — ED Notes (Signed)
Pt given graham crackers and saltine crackers to PO trial.

## 2019-10-22 NOTE — ED Triage Notes (Signed)
Pt reports abd pain, NVD for the past 9 days with an intermittent fever. Pt reports pain is to lower abd and is burning in nature.

## 2019-10-22 NOTE — ED Notes (Signed)
E-signature not working at this time. Pt verbalized understanding of D/C instructions, prescriptions and follow up care with no further questions at this time. Pt in NAD and ambulatory at time of D/C.  

## 2019-10-22 NOTE — Discharge Instructions (Addendum)
You were seen in the ED because of your nausea, abdominal pain, fevers and diarrhea.  We did blood work that did not show any significant problems. CT scan of your abdomen showed your stomach and area of surgery are intact without changes. Overall, you have evidence of a viral gastroenteritis, or stomach bug, that should improve with time.  Please drink plenty of fluids at home and stick with bland food over the next few days.  Treat any fevers that you may have with Tylenol and/or ibuprofen.  If you develop any worsening symptoms, worsening pain or fevers, please return to the ED.

## 2020-05-06 ENCOUNTER — Other Ambulatory Visit: Payer: Self-pay

## 2020-05-06 ENCOUNTER — Emergency Department: Payer: BLUE CROSS/BLUE SHIELD

## 2020-05-06 ENCOUNTER — Encounter: Payer: Self-pay | Admitting: Emergency Medicine

## 2020-05-06 ENCOUNTER — Emergency Department
Admission: EM | Admit: 2020-05-06 | Discharge: 2020-05-06 | Disposition: A | Discharge status: Home / Self Care | Payer: BLUE CROSS/BLUE SHIELD | Attending: Emergency Medicine | Admitting: Emergency Medicine

## 2020-05-06 DIAGNOSIS — R1012 Left upper quadrant pain: Secondary | ICD-10-CM | POA: Diagnosis not present

## 2020-05-06 DIAGNOSIS — K2900 Acute gastritis without bleeding: Secondary | ICD-10-CM | POA: Insufficient documentation

## 2020-05-06 DIAGNOSIS — F17210 Nicotine dependence, cigarettes, uncomplicated: Secondary | ICD-10-CM | POA: Insufficient documentation

## 2020-05-06 DIAGNOSIS — X500XXA Overexertion from strenuous movement or load, initial encounter: Secondary | ICD-10-CM | POA: Insufficient documentation

## 2020-05-06 DIAGNOSIS — R109 Unspecified abdominal pain: Secondary | ICD-10-CM | POA: Diagnosis not present

## 2020-05-06 LAB — URINALYSIS, COMPLETE (UACMP) WITH MICROSCOPIC
Bacteria, UA: NONE SEEN
Bilirubin Urine: NEGATIVE
Glucose, UA: NEGATIVE mg/dL
Hgb urine dipstick: NEGATIVE
Ketones, ur: NEGATIVE mg/dL
Leukocytes,Ua: NEGATIVE
Nitrite: NEGATIVE
Protein, ur: NEGATIVE mg/dL
Specific Gravity, Urine: >1.046 — ABNORMAL HIGH (ref 1.005–1.030)
Squamous Epithelial / HPF: NONE SEEN (ref 0–5)
pH: 6 (ref 5.0–8.0)

## 2020-05-06 LAB — COMPREHENSIVE METABOLIC PANEL
ALT: 19 U/L (ref 0–44)
AST: 24 U/L (ref 15–41)
Albumin: 4.9 g/dL (ref 3.5–5.0)
Alkaline Phosphatase: 54 U/L (ref 38–126)
Anion gap: 14 (ref 5–15)
BUN: 9 mg/dL (ref 6–20)
CO2: 22 mmol/L (ref 22–32)
Calcium: 9.4 mg/dL (ref 8.9–10.3)
Chloride: 104 mmol/L (ref 98–111)
Creatinine, Ser: 0.75 mg/dL (ref 0.61–1.24)
GFR, Estimated: >60 mL/min (ref >60)
Glucose, Bld: 93 mg/dL (ref 70–99)
Potassium: 3.1 mmol/L — ABNORMAL LOW (ref 3.5–5.1)
Sodium: 140 mmol/L (ref 135–145)
Total Bilirubin: 0.8 mg/dL (ref 0.3–1.2)
Total Protein: 7.8 g/dL (ref 6.5–8.1)

## 2020-05-06 LAB — CBC
HCT: 41.2 % (ref 39.0–52.0)
Hemoglobin: 14.4 g/dL (ref 13.0–17.0)
MCH: 34.4 pg — ABNORMAL HIGH (ref 26.0–34.0)
MCHC: 35 g/dL (ref 30.0–36.0)
MCV: 98.6 fL (ref 80.0–100.0)
Platelets: 277 10*3/uL (ref 150–400)
RBC: 4.18 MIL/uL — ABNORMAL LOW (ref 4.22–5.81)
RDW: 11.8 % (ref 11.5–15.5)
WBC: 10.4 10*3/uL (ref 4.0–10.5)
nRBC: 0 % (ref 0.0–0.2)

## 2020-05-06 LAB — LIPASE, BLOOD: Lipase: 25 U/L (ref 11–51)

## 2020-05-06 MED ORDER — METOCLOPRAMIDE HCL 10 MG PO TABS: 10.0000 mg | ORAL_TABLET | Freq: Four times a day (QID) | ORAL | 0 refills | Status: AC | PRN

## 2020-05-06 MED ORDER — ONDANSETRON HCL 4 MG/2ML IJ SOLN
4.0000 mg | Freq: Once | INTRAMUSCULAR | Status: AC
  Administered 2020-05-06: 4 mg via INTRAVENOUS
  Filled 2020-05-06: qty 2

## 2020-05-06 MED ORDER — FAMOTIDINE 20 MG PO TABS
40.0000 mg | ORAL_TABLET | Freq: Once | ORAL | Status: AC
  Administered 2020-05-06: 40 mg via ORAL
  Filled 2020-05-06: qty 2

## 2020-05-06 MED ORDER — ALUM & MAG HYDROXIDE-SIMETH 200-200-20 MG/5ML PO SUSP
30.0000 mL | Freq: Once | ORAL | Status: AC
  Administered 2020-05-06: 30 mL via ORAL
  Filled 2020-05-06: qty 30

## 2020-05-06 MED ORDER — IOHEXOL 300 MG/ML  SOLN
100.0000 mL | Freq: Once | INTRAMUSCULAR | Status: AC | PRN
  Administered 2020-05-06: 100 mL via INTRAVENOUS

## 2020-05-06 MED ORDER — KETOROLAC TROMETHAMINE 30 MG/ML IJ SOLN
15.0000 mg | INTRAMUSCULAR | Status: AC
  Administered 2020-05-06: 15 mg via INTRAVENOUS
  Filled 2020-05-06: qty 1

## 2020-05-06 MED ORDER — FAMOTIDINE 20 MG PO TABS
20.0000 mg | ORAL_TABLET | Freq: Two times a day (BID) | ORAL | 0 refills | Status: AC
Start: 2020-05-06 — End: ?

## 2020-05-06 MED ORDER — MORPHINE SULFATE (PF) 4 MG/ML IV SOLN
4.0000 mg | Freq: Once | INTRAVENOUS | Status: AC
  Administered 2020-05-06: 4 mg via INTRAVENOUS
  Filled 2020-05-06: qty 1

## 2020-05-06 MED ORDER — ALUMINUM-MAGNESIUM-SIMETHICONE 200-200-20 MG/5ML PO SUSP
30.0000 mL | Freq: Three times a day (TID) | ORAL | 0 refills | Status: AC
Start: 2020-05-06 — End: ?

## 2020-05-06 NOTE — ED Provider Notes (Signed)
[facility] Emergency Department Provider Note  ____________________________________________  Time seen: Approximately 11:23 PM  I have reviewed the triage vital signs and the nursing notes.   HISTORY  Chief Complaint Abdominal Pain    HPI Roberto Flores is a 35 y.o. male with a history of alcohol and opioid abuse as well as gastric fundoplication  who comes ED complaining of left-sided abdominal pain that started rapidly after lifting heavy lumber around noon today.  Onset was 20 minutes after lifting.  Does have a history of hernia repair.  No nausea or vomiting, no diarrhea or constipation.  No fevers or chills, no trauma.  Pain has been constant, severe, radiating to bilateral flanks since then.  No aggravating or alleviating factors.  Not positional, not worse with movement.     Past Medical History:  Diagnosis Date  . Alcohol dependence (HCC)   . Opioid dependence (HCC) 10/2018   claims he went through rehab for Fentanyl and no longer uses     Patient Active Problem List   Diagnosis Date Noted  . Abdominal pain, epigastric 08/09/2019  . Diaphragmatic hernia stangulating stomach s/p repair & partial gastrectomy 11/12/2018 11/12/2018  . Alcohol abuse 11/12/2018  . Episodic cannabis use 11/12/2018  . Nausea and vomiting 11/12/2018  . Polysubstance abuse (HCC) 11/12/2018  . History of methylenedioxymethamphetamine (MDMA) "Molly" use 11/12/2018  . Acute generalized peritonitis (HCC) 11/12/2018  . Chest pain, non-cardiac 11/12/2018  . Status post laparoscopic fundoplication 11/12/2018     Past Surgical History:  Procedure Laterality Date  . LAPAROSCOPIC NISSEN FUNDOPLICATION N/A 11/12/2018   Procedure: LAPAROSCOPIC REDUCTION OF hiatel hernia repair partial gastrectomy, EGD, DIAPHRAGM AND HERNIA REPAIR;  Surgeon: Karie Soda, MD;  Location: [facility];  Service: General;  Laterality: N/A;     Prior to Admission medications   Medication Sig Start  Date End Date Taking? Authorizing Provider  aluminum-magnesium hydroxide-simethicone (MAALOX) 200-200-20 MG/5ML SUSP Take 30 mLs by mouth 4 (four) times daily -  before meals and at bedtime. 05/06/20  Yes Sharman Cheek, MD  famotidine (PEPCID) 20 MG tablet Take 1 tablet (20 mg total) by mouth 2 (two) times daily. 05/06/20  Yes Sharman Cheek, MD  metoCLOPramide (REGLAN) 10 MG tablet Take 1 tablet (10 mg total) by mouth every 6 (six) hours as needed. 05/06/20  Yes Sharman Cheek, MD  omeprazole (PRILOSEC) 20 MG capsule Take 1 capsule (20 mg total) by mouth daily. 08/09/19   Arnaldo Natal, NP     Allergies Patient has no known allergies.   Family History  Problem Relation Age of Onset  . Dementia Maternal Grandfather   . Skin cancer Maternal Aunt   . Colon cancer Neg Hx   . Stomach cancer Neg Hx   . Pancreatic cancer Neg Hx   . Rectal cancer Neg Hx   . Esophageal cancer Neg Hx     Social History Social History   Tobacco Use  . Smoking status: Current Every Day Smoker    Packs/day: 1.50    Types: Cigarettes  . Smokeless tobacco: Never Used  Vaping Use  . Vaping Use: Never used  Substance Use Topics  . Alcohol use: Not Currently  . Drug use: Not Currently    Review of Systems  Constitutional:   No fever or chills.  ENT:   No sore throat. No rhinorrhea. Cardiovascular:   No chest pain or syncope. Respiratory:   No dyspnea or cough. Gastrointestinal: Positive as above for abdominal pain without vomiting and  diarrhea.  Musculoskeletal:   Negative for focal pain or swelling All other systems reviewed and are negative except as documented above in ROS and HPI.  ____________________________________________   PHYSICAL EXAM:  VITAL SIGNS: ED Triage Vitals  Enc Vitals Group     BP 05/06/20 1856 127/79     Pulse Rate 05/06/20 1856 93     Resp 05/06/20 1856 18     Temp 05/06/20 1856 98 F (36.7 C)     Temp Source 05/06/20 1856 Oral     SpO2 05/06/20 1856  97 %     Weight 05/06/20 1854 170 lb (77.1 kg)     Height 05/06/20 1854 6\' 2"  (1.88 m)     Head Circumference --      Peak Flow --      Pain Score 05/06/20 1854 8     Pain Loc --      Pain Edu? --      Excl. in GC? --     Vital signs reviewed, nursing assessments reviewed.   Constitutional:   Alert and oriented. Non-toxic appearance. Eyes:   Conjunctivae are normal. EOMI. PERRL. ENT      Head:   Normocephalic and atraumatic.      Nose:   Wearing a mask.      Mouth/Throat:   Wearing a mask.      Neck:   No meningismus. Full ROM. Hematological/Lymphatic/Immunilogical:   No cervical lymphadenopathy. Cardiovascular:   RRR. Symmetric bilateral radial and DP pulses.  No murmurs. Cap refill less than 2 seconds. Respiratory:   Normal respiratory effort without tachypnea/retractions. Breath sounds are clear and equal bilaterally. No wheezes/rales/rhonchi. Gastrointestinal:   Soft with left upper quadrant tenderness.  Patient is guarding..  There is no CVA tenderness.  No ventral or inguinal hernias Musculoskeletal:   Normal range of motion in all extremities. No joint effusions.  No lower extremity tenderness.  No edema. Neurologic:   Normal speech and language.  Motor grossly intact. No acute focal neurologic deficits are appreciated.  Skin:    Skin is warm, dry and intact. No rash noted.  No petechiae, purpura, or bullae.  ____________________________________________    LABS (pertinent positives/negatives) (all labs ordered are listed, but only abnormal results are displayed) Labs Reviewed  COMPREHENSIVE METABOLIC PANEL - Abnormal; Notable for the following components:      Result Value   Potassium 3.1 (*)    All other components within normal limits  CBC - Abnormal; Notable for the following components:   RBC 4.18 (*)    MCH 34.4 (*)    All other components within normal limits  LIPASE, BLOOD  URINALYSIS, COMPLETE (UACMP) WITH MICROSCOPIC    ____________________________________________   EKG    ____________________________________________    RADIOLOGY  CT ABDOMEN PELVIS W CONTRAST  Result Date: 05/06/2020 CLINICAL DATA:  Left abdominal pain which began at 1200 hours, history of hernia repair in similar location. EXAM: CT ABDOMEN AND PELVIS WITH CONTRAST TECHNIQUE: Multidetector CT imaging of the abdomen and pelvis was performed using the standard protocol following bolus administration of intravenous contrast. CONTRAST:  OMNIPAQUE IOHEXOL 300 MG/ML  SOLN COMPARISON:  CT 10/22/2019 FINDINGS: Lower chest: Lung bases are clear. Normal heart size. No pericardial effusion. Hepatobiliary: No worrisome focal liver lesions. Smooth liver surface contour. Normal hepatic attenuation. Gallbladder distension albeit without significant pericholecystic fluid or inflammation. Gallbladder closely apposes the anterior abdomen and may be palpable (Courvoisier's sign). No significant biliary ductal dilatation or visible calcified gallstones however  a punctate focus of gas is seen adjacent the not convincingly within the distal common bile duct (2/29, 5/29). No other significant pneumobilia. (2/32) Pancreas: No pancreatic ductal dilatation or surrounding inflammatory changes. Spleen: Normal in size. No concerning splenic lesions. Adrenals/Urinary Tract: Normal adrenal glands. Kidneys are normally located with symmetric enhancement. No suspicious renal lesion, urolithiasis or hydronephrosis. Urinary bladder is unremarkable for the degree of distention. Stomach/Bowel: Challenging assessment of the bowel and mesentery with a paucity of intraperitoneal fat. Distal esophagus unremarkable. Postsurgical changes at the gastric fundus from prior Nissen fundoplication without gross complication at this time. Stomach distended with ingested material. Normal duodenal sweep across the midline abdomen. No small bowel thickening or dilatation. No evidence of  obstruction. Normal appendix in the right lower quadrant. No colonic dilatation or wall thickening. Vascular/Lymphatic: No significant vascular findings are present. No enlarged abdominal or pelvic lymph nodes. Reproductive: The prostate and seminal vesicles are unremarkable. Other: No abdominopelvic free fluid or free gas. No bowel containing hernias. Musculoskeletal: No acute osseous abnormality or suspicious osseous lesion. IMPRESSION: 1. Gallbladder distension albeit without significant pericholecystic fluid or inflammation. Gallbladder closely apposes the anterior abdomen and may be palpable (Courvoisier's sign). Recommend correlation with patient's symptoms and consider further evaluation with right upper quadrant ultrasound as clinically indicated as isolated gallbladder distension, wall nonspecific can be seen with acute acalculous cholecystitis. 2. Punctate focus of gas is seen adjacent to, but not convincingly within the distal common bile duct. Could reflect a tiny duodenal diverticulum versus pneumobilia though no other intraductal gas is seen. Recommend correlation with LFTs. 3. Postsurgical changes at the gastric fundus from prior Nissen fundoplication without gross complication at this time. Electronically Signed   By: Kreg Shropshire M.D.   On: 05/06/2020 20:19   US ABDOMEN LIMITED RUQ (LIVER/GB)  Result Date: 05/06/2020 CLINICAL DATA:  Upper abdominal pain, CT suspicious for gallbladder inflammation. EXAM: ULTRASOUND ABDOMEN LIMITED RIGHT UPPER QUADRANT COMPARISON:  Same day CT abdomen pelvis. FINDINGS: Gallbladder: Gallbladder is distended measuring 11.6 x 3.4 cm. No gallstones, pericholecystic fluid or wall thickening visualized. No sonographic Murphy sign noted by sonographer. Common bile duct: Diameter: 4 mm Liver: No focal lesion identified. Within normal limits in parenchymal echogenicity. Portal vein is patent on color Doppler imaging with normal direction of blood flow towards the liver.  Other: None. IMPRESSION: Distended gallbladder without cholelithiasis or sonographic evidence of acute cholecystitis. Electronically Signed   By: Maudry Mayhew MD   On: 05/06/2020 22:46    ____________________________________________   PROCEDURES Procedures  ____________________________________________  DIFFERENTIAL DIAGNOSIS   Internal hernia, abdominal wall strain, perforated ulcer, bowel obstruction, gastritis, pancreatitis, biliary colic  CLINICAL IMPRESSION / ASSESSMENT AND PLAN / ED COURSE  Medications ordered in the ED: Medications  morphine 4 MG/ML injection 4 mg (4 mg Intravenous Given 05/06/20 1942)  ondansetron (ZOFRAN) injection 4 mg (4 mg Intravenous Given 05/06/20 1938)  ketorolac (TORADOL) 30 MG/ML injection 15 mg (15 mg Intravenous Given 05/06/20 1939)  iohexol (OMNIPAQUE) 300 MG/ML solution 100 mL (100 mLs Intravenous Contrast Given 05/06/20 2002)  alum & mag hydroxide-simeth (MAALOX/MYLANTA) 200-200-20 MG/5ML suspension 30 mL (30 mLs Oral Given 05/06/20 2253)  famotidine (PEPCID) tablet 40 mg (40 mg Oral Given 05/06/20 2253)    Pertinent labs & imaging results that were available during my care of the patient were reviewed by me and considered in my medical decision making (see chart for details).  Shawnte Demarest was evaluated in Emergency Department on 05/06/2020 for the symptoms described in  the history of present illness. He was evaluated in the context of the global COVID-19 pandemic, which necessitated consideration that the patient might be at risk for infection with the SARS-CoV-2 virus that causes COVID-19. Institutional protocols and algorithms that pertain to the evaluation of patients at risk for COVID-19 are in a state of rapid change based on information released by regulatory bodies including the CDC and federal and state organizations. These policies and algorithms were followed during the patient's care in the ED.   Patient presents with left upper abdominal pain,  abnormal abdominal exam.  Vital signs are normal, he is nontoxic, no signs of shock.  Not intoxicated.  Lab panel is normal.  CT scan is essentially normal but did raise possibility of biliary disease, so right upper quadrant ultrasound was obtained which is normal.  On reassessment, patient is feeling much better, abdominal exam is soft and benign.  Patient given antacids, he is tolerating oral intake.  Suspect gastritis, stable for discharge home on Pepcid Reglan and Maalox.      ____________________________________________   FINAL CLINICAL IMPRESSION(S) / ED DIAGNOSES    Final diagnoses:  Left upper quadrant abdominal pain  Acute gastritis without hemorrhage, unspecified gastritis type     ED Discharge Orders         Ordered    aluminum-magnesium hydroxide-simethicone (MAALOX) 200-200-20 MG/5ML SUSP  3 times daily before meals & bedtime        05/06/20 2322    famotidine (PEPCID) 20 MG tablet  2 times daily        05/06/20 2322    metoCLOPramide (REGLAN) 10 MG tablet  Every 6 hours PRN        05/06/20 2322          Portions of this note were generated with dragon dictation software. Dictation errors may occur despite best attempts at proofreading.   Sharman Cheek, MD 05/06/20 2328

## 2020-05-06 NOTE — ED Triage Notes (Signed)
Pt comes into the ED via POV c/o left abdominal pain.  Pt states the pain started today around 12:00.  Pt had a hernia repair around the same area about a year ago.  Pt denies any CP, SHOB, dizziness, N/V/D. Pt ambulatory to triage and in NAD.

## 2020-06-06 DIAGNOSIS — F1020 Alcohol dependence, uncomplicated: Secondary | ICD-10-CM | POA: Diagnosis not present

## 2020-06-06 DIAGNOSIS — Z20822 Contact with and (suspected) exposure to covid-19: Secondary | ICD-10-CM | POA: Diagnosis not present

## 2020-06-07 DIAGNOSIS — F1020 Alcohol dependence, uncomplicated: Secondary | ICD-10-CM | POA: Diagnosis not present

## 2020-06-08 DIAGNOSIS — F1020 Alcohol dependence, uncomplicated: Secondary | ICD-10-CM | POA: Diagnosis not present

## 2020-06-09 DIAGNOSIS — F1020 Alcohol dependence, uncomplicated: Secondary | ICD-10-CM | POA: Diagnosis not present

## 2020-06-10 DIAGNOSIS — F1020 Alcohol dependence, uncomplicated: Secondary | ICD-10-CM | POA: Diagnosis not present

## 2020-07-07 DIAGNOSIS — F1020 Alcohol dependence, uncomplicated: Secondary | ICD-10-CM | POA: Diagnosis not present

## 2020-07-07 DIAGNOSIS — F1420 Cocaine dependence, uncomplicated: Secondary | ICD-10-CM | POA: Diagnosis not present

## 2020-07-07 DIAGNOSIS — F1120 Opioid dependence, uncomplicated: Secondary | ICD-10-CM | POA: Diagnosis not present

## 2020-07-07 DIAGNOSIS — F1220 Cannabis dependence, uncomplicated: Secondary | ICD-10-CM | POA: Diagnosis not present

## 2020-07-08 DIAGNOSIS — F1620 Hallucinogen dependence, uncomplicated: Secondary | ICD-10-CM | POA: Diagnosis not present

## 2020-07-08 DIAGNOSIS — F1020 Alcohol dependence, uncomplicated: Secondary | ICD-10-CM | POA: Diagnosis not present

## 2020-07-08 DIAGNOSIS — F1520 Other stimulant dependence, uncomplicated: Secondary | ICD-10-CM | POA: Diagnosis not present

## 2020-07-08 DIAGNOSIS — F1120 Opioid dependence, uncomplicated: Secondary | ICD-10-CM | POA: Diagnosis not present

## 2020-07-09 DIAGNOSIS — F1220 Cannabis dependence, uncomplicated: Secondary | ICD-10-CM | POA: Diagnosis not present

## 2020-07-09 DIAGNOSIS — F1120 Opioid dependence, uncomplicated: Secondary | ICD-10-CM | POA: Diagnosis not present

## 2020-07-09 DIAGNOSIS — F1520 Other stimulant dependence, uncomplicated: Secondary | ICD-10-CM | POA: Diagnosis not present

## 2020-07-09 DIAGNOSIS — F1020 Alcohol dependence, uncomplicated: Secondary | ICD-10-CM | POA: Diagnosis not present

## 2020-07-09 DIAGNOSIS — F1420 Cocaine dependence, uncomplicated: Secondary | ICD-10-CM | POA: Diagnosis not present

## 2020-07-10 DIAGNOSIS — F1120 Opioid dependence, uncomplicated: Secondary | ICD-10-CM | POA: Diagnosis not present

## 2020-07-10 DIAGNOSIS — F1520 Other stimulant dependence, uncomplicated: Secondary | ICD-10-CM | POA: Diagnosis not present

## 2020-07-11 DIAGNOSIS — F1020 Alcohol dependence, uncomplicated: Secondary | ICD-10-CM | POA: Diagnosis not present

## 2020-07-11 DIAGNOSIS — F1520 Other stimulant dependence, uncomplicated: Secondary | ICD-10-CM | POA: Diagnosis not present

## 2020-07-11 DIAGNOSIS — F1420 Cocaine dependence, uncomplicated: Secondary | ICD-10-CM | POA: Diagnosis not present

## 2020-07-11 DIAGNOSIS — F1220 Cannabis dependence, uncomplicated: Secondary | ICD-10-CM | POA: Diagnosis not present

## 2020-07-11 DIAGNOSIS — F1120 Opioid dependence, uncomplicated: Secondary | ICD-10-CM | POA: Diagnosis not present

## 2020-07-14 DIAGNOSIS — F1420 Cocaine dependence, uncomplicated: Secondary | ICD-10-CM | POA: Diagnosis not present

## 2020-07-14 DIAGNOSIS — F1220 Cannabis dependence, uncomplicated: Secondary | ICD-10-CM | POA: Diagnosis not present

## 2020-07-14 DIAGNOSIS — F1520 Other stimulant dependence, uncomplicated: Secondary | ICD-10-CM | POA: Diagnosis not present

## 2020-07-14 DIAGNOSIS — F1020 Alcohol dependence, uncomplicated: Secondary | ICD-10-CM | POA: Diagnosis not present

## 2020-07-14 DIAGNOSIS — F1120 Opioid dependence, uncomplicated: Secondary | ICD-10-CM | POA: Diagnosis not present

## 2020-07-15 DIAGNOSIS — F1120 Opioid dependence, uncomplicated: Secondary | ICD-10-CM | POA: Diagnosis not present

## 2020-07-15 DIAGNOSIS — F1520 Other stimulant dependence, uncomplicated: Secondary | ICD-10-CM | POA: Diagnosis not present

## 2020-07-16 DIAGNOSIS — F1620 Hallucinogen dependence, uncomplicated: Secondary | ICD-10-CM | POA: Diagnosis not present

## 2020-07-16 DIAGNOSIS — F1120 Opioid dependence, uncomplicated: Secondary | ICD-10-CM | POA: Diagnosis not present

## 2020-07-16 DIAGNOSIS — F1020 Alcohol dependence, uncomplicated: Secondary | ICD-10-CM | POA: Diagnosis not present

## 2020-07-16 DIAGNOSIS — F1520 Other stimulant dependence, uncomplicated: Secondary | ICD-10-CM | POA: Diagnosis not present

## 2020-07-17 DIAGNOSIS — F1120 Opioid dependence, uncomplicated: Secondary | ICD-10-CM | POA: Diagnosis not present

## 2020-07-17 DIAGNOSIS — F1520 Other stimulant dependence, uncomplicated: Secondary | ICD-10-CM | POA: Diagnosis not present

## 2020-07-18 DIAGNOSIS — F1420 Cocaine dependence, uncomplicated: Secondary | ICD-10-CM | POA: Diagnosis not present

## 2020-07-18 DIAGNOSIS — F1020 Alcohol dependence, uncomplicated: Secondary | ICD-10-CM | POA: Diagnosis not present

## 2020-07-18 DIAGNOSIS — F1520 Other stimulant dependence, uncomplicated: Secondary | ICD-10-CM | POA: Diagnosis not present

## 2020-07-18 DIAGNOSIS — F1120 Opioid dependence, uncomplicated: Secondary | ICD-10-CM | POA: Diagnosis not present

## 2020-07-18 DIAGNOSIS — F1620 Hallucinogen dependence, uncomplicated: Secondary | ICD-10-CM | POA: Diagnosis not present

## 2020-07-21 DIAGNOSIS — F1520 Other stimulant dependence, uncomplicated: Secondary | ICD-10-CM | POA: Diagnosis not present

## 2020-07-21 DIAGNOSIS — F1020 Alcohol dependence, uncomplicated: Secondary | ICD-10-CM | POA: Diagnosis not present

## 2020-07-21 DIAGNOSIS — F1120 Opioid dependence, uncomplicated: Secondary | ICD-10-CM | POA: Diagnosis not present

## 2020-07-21 DIAGNOSIS — F1420 Cocaine dependence, uncomplicated: Secondary | ICD-10-CM | POA: Diagnosis not present

## 2020-07-21 DIAGNOSIS — F1220 Cannabis dependence, uncomplicated: Secondary | ICD-10-CM | POA: Diagnosis not present

## 2020-07-22 DIAGNOSIS — F1120 Opioid dependence, uncomplicated: Secondary | ICD-10-CM | POA: Diagnosis not present

## 2020-07-22 DIAGNOSIS — F1520 Other stimulant dependence, uncomplicated: Secondary | ICD-10-CM | POA: Diagnosis not present

## 2020-07-23 DIAGNOSIS — F1420 Cocaine dependence, uncomplicated: Secondary | ICD-10-CM | POA: Diagnosis not present

## 2020-07-23 DIAGNOSIS — F1220 Cannabis dependence, uncomplicated: Secondary | ICD-10-CM | POA: Diagnosis not present

## 2020-07-23 DIAGNOSIS — F1520 Other stimulant dependence, uncomplicated: Secondary | ICD-10-CM | POA: Diagnosis not present

## 2020-07-23 DIAGNOSIS — F1120 Opioid dependence, uncomplicated: Secondary | ICD-10-CM | POA: Diagnosis not present

## 2020-07-23 DIAGNOSIS — F1020 Alcohol dependence, uncomplicated: Secondary | ICD-10-CM | POA: Diagnosis not present

## 2020-07-24 DIAGNOSIS — F1120 Opioid dependence, uncomplicated: Secondary | ICD-10-CM | POA: Diagnosis not present

## 2020-07-24 DIAGNOSIS — F1520 Other stimulant dependence, uncomplicated: Secondary | ICD-10-CM | POA: Diagnosis not present

## 2020-07-25 DIAGNOSIS — F1020 Alcohol dependence, uncomplicated: Secondary | ICD-10-CM | POA: Diagnosis not present

## 2020-07-25 DIAGNOSIS — F1120 Opioid dependence, uncomplicated: Secondary | ICD-10-CM | POA: Diagnosis not present

## 2020-07-25 DIAGNOSIS — F1520 Other stimulant dependence, uncomplicated: Secondary | ICD-10-CM | POA: Diagnosis not present

## 2020-07-25 DIAGNOSIS — F1220 Cannabis dependence, uncomplicated: Secondary | ICD-10-CM | POA: Diagnosis not present

## 2020-07-25 DIAGNOSIS — F1420 Cocaine dependence, uncomplicated: Secondary | ICD-10-CM | POA: Diagnosis not present

## 2020-07-28 DIAGNOSIS — F1420 Cocaine dependence, uncomplicated: Secondary | ICD-10-CM | POA: Diagnosis not present

## 2020-07-28 DIAGNOSIS — F1020 Alcohol dependence, uncomplicated: Secondary | ICD-10-CM | POA: Diagnosis not present

## 2020-07-28 DIAGNOSIS — F1220 Cannabis dependence, uncomplicated: Secondary | ICD-10-CM | POA: Diagnosis not present

## 2020-07-28 DIAGNOSIS — F1120 Opioid dependence, uncomplicated: Secondary | ICD-10-CM | POA: Diagnosis not present

## 2020-07-28 DIAGNOSIS — F1520 Other stimulant dependence, uncomplicated: Secondary | ICD-10-CM | POA: Diagnosis not present

## 2020-07-29 DIAGNOSIS — F1520 Other stimulant dependence, uncomplicated: Secondary | ICD-10-CM | POA: Diagnosis not present

## 2020-07-29 DIAGNOSIS — F1120 Opioid dependence, uncomplicated: Secondary | ICD-10-CM | POA: Diagnosis not present

## 2020-07-30 DIAGNOSIS — F1420 Cocaine dependence, uncomplicated: Secondary | ICD-10-CM | POA: Diagnosis not present

## 2020-07-30 DIAGNOSIS — F1020 Alcohol dependence, uncomplicated: Secondary | ICD-10-CM | POA: Diagnosis not present

## 2020-07-30 DIAGNOSIS — F1220 Cannabis dependence, uncomplicated: Secondary | ICD-10-CM | POA: Diagnosis not present

## 2020-07-30 DIAGNOSIS — F1520 Other stimulant dependence, uncomplicated: Secondary | ICD-10-CM | POA: Diagnosis not present

## 2020-07-30 DIAGNOSIS — F1120 Opioid dependence, uncomplicated: Secondary | ICD-10-CM | POA: Diagnosis not present

## 2020-07-31 DIAGNOSIS — F1120 Opioid dependence, uncomplicated: Secondary | ICD-10-CM | POA: Diagnosis not present

## 2020-07-31 DIAGNOSIS — F1520 Other stimulant dependence, uncomplicated: Secondary | ICD-10-CM | POA: Diagnosis not present

## 2020-08-01 DIAGNOSIS — F1420 Cocaine dependence, uncomplicated: Secondary | ICD-10-CM | POA: Diagnosis not present

## 2020-08-01 DIAGNOSIS — F1020 Alcohol dependence, uncomplicated: Secondary | ICD-10-CM | POA: Diagnosis not present

## 2020-08-01 DIAGNOSIS — F1120 Opioid dependence, uncomplicated: Secondary | ICD-10-CM | POA: Diagnosis not present

## 2020-08-01 DIAGNOSIS — F1520 Other stimulant dependence, uncomplicated: Secondary | ICD-10-CM | POA: Diagnosis not present

## 2020-08-01 DIAGNOSIS — F1220 Cannabis dependence, uncomplicated: Secondary | ICD-10-CM | POA: Diagnosis not present

## 2020-08-04 DIAGNOSIS — F1020 Alcohol dependence, uncomplicated: Secondary | ICD-10-CM | POA: Diagnosis not present

## 2020-08-04 DIAGNOSIS — F1520 Other stimulant dependence, uncomplicated: Secondary | ICD-10-CM | POA: Diagnosis not present

## 2020-08-04 DIAGNOSIS — F1220 Cannabis dependence, uncomplicated: Secondary | ICD-10-CM | POA: Diagnosis not present

## 2020-08-04 DIAGNOSIS — F1120 Opioid dependence, uncomplicated: Secondary | ICD-10-CM | POA: Diagnosis not present

## 2020-08-04 DIAGNOSIS — F1420 Cocaine dependence, uncomplicated: Secondary | ICD-10-CM | POA: Diagnosis not present

## 2020-08-05 DIAGNOSIS — F1120 Opioid dependence, uncomplicated: Secondary | ICD-10-CM | POA: Diagnosis not present

## 2020-08-05 DIAGNOSIS — F1520 Other stimulant dependence, uncomplicated: Secondary | ICD-10-CM | POA: Diagnosis not present

## 2020-08-06 DIAGNOSIS — F1220 Cannabis dependence, uncomplicated: Secondary | ICD-10-CM | POA: Diagnosis not present

## 2020-08-06 DIAGNOSIS — F1520 Other stimulant dependence, uncomplicated: Secondary | ICD-10-CM | POA: Diagnosis not present

## 2020-08-06 DIAGNOSIS — F1020 Alcohol dependence, uncomplicated: Secondary | ICD-10-CM | POA: Diagnosis not present

## 2020-08-06 DIAGNOSIS — F1420 Cocaine dependence, uncomplicated: Secondary | ICD-10-CM | POA: Diagnosis not present

## 2020-08-06 DIAGNOSIS — F1120 Opioid dependence, uncomplicated: Secondary | ICD-10-CM | POA: Diagnosis not present

## 2020-08-07 DIAGNOSIS — F1120 Opioid dependence, uncomplicated: Secondary | ICD-10-CM | POA: Diagnosis not present

## 2020-08-07 DIAGNOSIS — F1520 Other stimulant dependence, uncomplicated: Secondary | ICD-10-CM | POA: Diagnosis not present

## 2020-08-08 DIAGNOSIS — F1120 Opioid dependence, uncomplicated: Secondary | ICD-10-CM | POA: Diagnosis not present

## 2020-08-08 DIAGNOSIS — F1420 Cocaine dependence, uncomplicated: Secondary | ICD-10-CM | POA: Diagnosis not present

## 2020-08-08 DIAGNOSIS — F1220 Cannabis dependence, uncomplicated: Secondary | ICD-10-CM | POA: Diagnosis not present

## 2020-08-08 DIAGNOSIS — F1020 Alcohol dependence, uncomplicated: Secondary | ICD-10-CM | POA: Diagnosis not present

## 2020-08-08 DIAGNOSIS — F1520 Other stimulant dependence, uncomplicated: Secondary | ICD-10-CM | POA: Diagnosis not present

## 2020-08-11 DIAGNOSIS — F1520 Other stimulant dependence, uncomplicated: Secondary | ICD-10-CM | POA: Diagnosis not present

## 2020-08-11 DIAGNOSIS — F321 Major depressive disorder, single episode, moderate: Secondary | ICD-10-CM | POA: Diagnosis not present

## 2020-08-11 DIAGNOSIS — F1420 Cocaine dependence, uncomplicated: Secondary | ICD-10-CM | POA: Diagnosis not present

## 2020-08-11 DIAGNOSIS — F1120 Opioid dependence, uncomplicated: Secondary | ICD-10-CM | POA: Diagnosis not present

## 2020-08-11 DIAGNOSIS — F1020 Alcohol dependence, uncomplicated: Secondary | ICD-10-CM | POA: Diagnosis not present

## 2020-08-11 DIAGNOSIS — F1220 Cannabis dependence, uncomplicated: Secondary | ICD-10-CM | POA: Diagnosis not present

## 2020-08-12 DIAGNOSIS — F1120 Opioid dependence, uncomplicated: Secondary | ICD-10-CM | POA: Diagnosis not present

## 2020-08-12 DIAGNOSIS — F1520 Other stimulant dependence, uncomplicated: Secondary | ICD-10-CM | POA: Diagnosis not present

## 2020-08-13 DIAGNOSIS — F1520 Other stimulant dependence, uncomplicated: Secondary | ICD-10-CM | POA: Diagnosis not present

## 2020-08-13 DIAGNOSIS — F1220 Cannabis dependence, uncomplicated: Secondary | ICD-10-CM | POA: Diagnosis not present

## 2020-08-13 DIAGNOSIS — F1420 Cocaine dependence, uncomplicated: Secondary | ICD-10-CM | POA: Diagnosis not present

## 2020-08-13 DIAGNOSIS — F1120 Opioid dependence, uncomplicated: Secondary | ICD-10-CM | POA: Diagnosis not present

## 2020-08-13 DIAGNOSIS — F1020 Alcohol dependence, uncomplicated: Secondary | ICD-10-CM | POA: Diagnosis not present

## 2020-08-14 DIAGNOSIS — F1120 Opioid dependence, uncomplicated: Secondary | ICD-10-CM | POA: Diagnosis not present

## 2020-08-14 DIAGNOSIS — F1520 Other stimulant dependence, uncomplicated: Secondary | ICD-10-CM | POA: Diagnosis not present

## 2020-08-15 DIAGNOSIS — F1220 Cannabis dependence, uncomplicated: Secondary | ICD-10-CM | POA: Diagnosis not present

## 2020-08-15 DIAGNOSIS — F1020 Alcohol dependence, uncomplicated: Secondary | ICD-10-CM | POA: Diagnosis not present

## 2020-08-15 DIAGNOSIS — F1420 Cocaine dependence, uncomplicated: Secondary | ICD-10-CM | POA: Diagnosis not present

## 2020-08-15 DIAGNOSIS — F1520 Other stimulant dependence, uncomplicated: Secondary | ICD-10-CM | POA: Diagnosis not present

## 2020-08-15 DIAGNOSIS — F1120 Opioid dependence, uncomplicated: Secondary | ICD-10-CM | POA: Diagnosis not present

## 2020-08-18 DIAGNOSIS — F1120 Opioid dependence, uncomplicated: Secondary | ICD-10-CM | POA: Diagnosis not present

## 2020-08-18 DIAGNOSIS — F1420 Cocaine dependence, uncomplicated: Secondary | ICD-10-CM | POA: Diagnosis not present

## 2020-08-18 DIAGNOSIS — F1020 Alcohol dependence, uncomplicated: Secondary | ICD-10-CM | POA: Diagnosis not present

## 2020-08-18 DIAGNOSIS — F1520 Other stimulant dependence, uncomplicated: Secondary | ICD-10-CM | POA: Diagnosis not present

## 2020-08-18 DIAGNOSIS — F1220 Cannabis dependence, uncomplicated: Secondary | ICD-10-CM | POA: Diagnosis not present

## 2020-08-19 DIAGNOSIS — F1520 Other stimulant dependence, uncomplicated: Secondary | ICD-10-CM | POA: Diagnosis not present

## 2020-08-19 DIAGNOSIS — F1120 Opioid dependence, uncomplicated: Secondary | ICD-10-CM | POA: Diagnosis not present

## 2020-08-20 DIAGNOSIS — F1120 Opioid dependence, uncomplicated: Secondary | ICD-10-CM | POA: Diagnosis not present

## 2020-08-20 DIAGNOSIS — F1020 Alcohol dependence, uncomplicated: Secondary | ICD-10-CM | POA: Diagnosis not present

## 2020-08-20 DIAGNOSIS — F1520 Other stimulant dependence, uncomplicated: Secondary | ICD-10-CM | POA: Diagnosis not present

## 2020-08-20 DIAGNOSIS — F1420 Cocaine dependence, uncomplicated: Secondary | ICD-10-CM | POA: Diagnosis not present

## 2020-08-20 DIAGNOSIS — F1220 Cannabis dependence, uncomplicated: Secondary | ICD-10-CM | POA: Diagnosis not present

## 2020-08-21 DIAGNOSIS — F1120 Opioid dependence, uncomplicated: Secondary | ICD-10-CM | POA: Diagnosis not present

## 2020-08-21 DIAGNOSIS — F1520 Other stimulant dependence, uncomplicated: Secondary | ICD-10-CM | POA: Diagnosis not present

## 2020-08-22 DIAGNOSIS — F1120 Opioid dependence, uncomplicated: Secondary | ICD-10-CM | POA: Diagnosis not present

## 2020-08-22 DIAGNOSIS — F1520 Other stimulant dependence, uncomplicated: Secondary | ICD-10-CM | POA: Diagnosis not present

## 2020-08-22 DIAGNOSIS — F1220 Cannabis dependence, uncomplicated: Secondary | ICD-10-CM | POA: Diagnosis not present

## 2020-08-22 DIAGNOSIS — F1020 Alcohol dependence, uncomplicated: Secondary | ICD-10-CM | POA: Diagnosis not present

## 2020-08-22 DIAGNOSIS — F1420 Cocaine dependence, uncomplicated: Secondary | ICD-10-CM | POA: Diagnosis not present

## 2020-08-25 DIAGNOSIS — F1420 Cocaine dependence, uncomplicated: Secondary | ICD-10-CM | POA: Diagnosis not present

## 2020-08-25 DIAGNOSIS — F1020 Alcohol dependence, uncomplicated: Secondary | ICD-10-CM | POA: Diagnosis not present

## 2020-08-25 DIAGNOSIS — F1220 Cannabis dependence, uncomplicated: Secondary | ICD-10-CM | POA: Diagnosis not present

## 2020-08-25 DIAGNOSIS — F1120 Opioid dependence, uncomplicated: Secondary | ICD-10-CM | POA: Diagnosis not present

## 2020-08-26 DIAGNOSIS — F1120 Opioid dependence, uncomplicated: Secondary | ICD-10-CM | POA: Diagnosis not present

## 2020-08-26 DIAGNOSIS — F1520 Other stimulant dependence, uncomplicated: Secondary | ICD-10-CM | POA: Diagnosis not present

## 2020-08-27 DIAGNOSIS — F1520 Other stimulant dependence, uncomplicated: Secondary | ICD-10-CM | POA: Diagnosis not present

## 2020-08-27 DIAGNOSIS — F1220 Cannabis dependence, uncomplicated: Secondary | ICD-10-CM | POA: Diagnosis not present

## 2020-08-27 DIAGNOSIS — F1020 Alcohol dependence, uncomplicated: Secondary | ICD-10-CM | POA: Diagnosis not present

## 2020-08-27 DIAGNOSIS — F1120 Opioid dependence, uncomplicated: Secondary | ICD-10-CM | POA: Diagnosis not present

## 2020-08-27 DIAGNOSIS — F1420 Cocaine dependence, uncomplicated: Secondary | ICD-10-CM | POA: Diagnosis not present

## 2020-08-28 DIAGNOSIS — F1120 Opioid dependence, uncomplicated: Secondary | ICD-10-CM | POA: Diagnosis not present

## 2020-08-28 DIAGNOSIS — F1520 Other stimulant dependence, uncomplicated: Secondary | ICD-10-CM | POA: Diagnosis not present

## 2020-08-29 DIAGNOSIS — F1220 Cannabis dependence, uncomplicated: Secondary | ICD-10-CM | POA: Diagnosis not present

## 2020-08-29 DIAGNOSIS — F1520 Other stimulant dependence, uncomplicated: Secondary | ICD-10-CM | POA: Diagnosis not present

## 2020-08-29 DIAGNOSIS — F1120 Opioid dependence, uncomplicated: Secondary | ICD-10-CM | POA: Diagnosis not present

## 2020-08-29 DIAGNOSIS — F1020 Alcohol dependence, uncomplicated: Secondary | ICD-10-CM | POA: Diagnosis not present

## 2020-08-29 DIAGNOSIS — F1420 Cocaine dependence, uncomplicated: Secondary | ICD-10-CM | POA: Diagnosis not present

## 2020-09-01 DIAGNOSIS — F1520 Other stimulant dependence, uncomplicated: Secondary | ICD-10-CM | POA: Diagnosis not present

## 2020-09-01 DIAGNOSIS — F1420 Cocaine dependence, uncomplicated: Secondary | ICD-10-CM | POA: Diagnosis not present

## 2020-09-01 DIAGNOSIS — F1120 Opioid dependence, uncomplicated: Secondary | ICD-10-CM | POA: Diagnosis not present

## 2020-09-01 DIAGNOSIS — F1220 Cannabis dependence, uncomplicated: Secondary | ICD-10-CM | POA: Diagnosis not present

## 2020-09-01 DIAGNOSIS — F1020 Alcohol dependence, uncomplicated: Secondary | ICD-10-CM | POA: Diagnosis not present

## 2020-09-02 DIAGNOSIS — F1120 Opioid dependence, uncomplicated: Secondary | ICD-10-CM | POA: Diagnosis not present

## 2020-09-02 DIAGNOSIS — F1520 Other stimulant dependence, uncomplicated: Secondary | ICD-10-CM | POA: Diagnosis not present

## 2020-09-03 DIAGNOSIS — F1520 Other stimulant dependence, uncomplicated: Secondary | ICD-10-CM | POA: Diagnosis not present

## 2020-09-03 DIAGNOSIS — F1020 Alcohol dependence, uncomplicated: Secondary | ICD-10-CM | POA: Diagnosis not present

## 2020-09-03 DIAGNOSIS — F1120 Opioid dependence, uncomplicated: Secondary | ICD-10-CM | POA: Diagnosis not present

## 2020-09-03 DIAGNOSIS — F1420 Cocaine dependence, uncomplicated: Secondary | ICD-10-CM | POA: Diagnosis not present

## 2020-09-03 DIAGNOSIS — F1220 Cannabis dependence, uncomplicated: Secondary | ICD-10-CM | POA: Diagnosis not present

## 2020-09-04 DIAGNOSIS — F1520 Other stimulant dependence, uncomplicated: Secondary | ICD-10-CM | POA: Diagnosis not present

## 2020-09-04 DIAGNOSIS — F1120 Opioid dependence, uncomplicated: Secondary | ICD-10-CM | POA: Diagnosis not present

## 2020-09-05 DIAGNOSIS — F1420 Cocaine dependence, uncomplicated: Secondary | ICD-10-CM | POA: Diagnosis not present

## 2020-09-05 DIAGNOSIS — F1120 Opioid dependence, uncomplicated: Secondary | ICD-10-CM | POA: Diagnosis not present

## 2020-09-05 DIAGNOSIS — F1220 Cannabis dependence, uncomplicated: Secondary | ICD-10-CM | POA: Diagnosis not present

## 2020-09-05 DIAGNOSIS — F1520 Other stimulant dependence, uncomplicated: Secondary | ICD-10-CM | POA: Diagnosis not present

## 2020-09-05 DIAGNOSIS — F1020 Alcohol dependence, uncomplicated: Secondary | ICD-10-CM | POA: Diagnosis not present

## 2020-09-08 DIAGNOSIS — F1120 Opioid dependence, uncomplicated: Secondary | ICD-10-CM | POA: Diagnosis not present

## 2020-09-08 DIAGNOSIS — F1520 Other stimulant dependence, uncomplicated: Secondary | ICD-10-CM | POA: Diagnosis not present

## 2020-09-09 DIAGNOSIS — F1520 Other stimulant dependence, uncomplicated: Secondary | ICD-10-CM | POA: Diagnosis not present

## 2020-09-09 DIAGNOSIS — F1120 Opioid dependence, uncomplicated: Secondary | ICD-10-CM | POA: Diagnosis not present

## 2020-09-10 DIAGNOSIS — F1520 Other stimulant dependence, uncomplicated: Secondary | ICD-10-CM | POA: Diagnosis not present

## 2020-09-10 DIAGNOSIS — F1220 Cannabis dependence, uncomplicated: Secondary | ICD-10-CM | POA: Diagnosis not present

## 2020-09-10 DIAGNOSIS — F1420 Cocaine dependence, uncomplicated: Secondary | ICD-10-CM | POA: Diagnosis not present

## 2020-09-10 DIAGNOSIS — F1020 Alcohol dependence, uncomplicated: Secondary | ICD-10-CM | POA: Diagnosis not present

## 2020-09-10 DIAGNOSIS — F1120 Opioid dependence, uncomplicated: Secondary | ICD-10-CM | POA: Diagnosis not present

## 2020-09-11 DIAGNOSIS — F1520 Other stimulant dependence, uncomplicated: Secondary | ICD-10-CM | POA: Diagnosis not present

## 2020-09-11 DIAGNOSIS — F1120 Opioid dependence, uncomplicated: Secondary | ICD-10-CM | POA: Diagnosis not present

## 2020-09-12 DIAGNOSIS — F1520 Other stimulant dependence, uncomplicated: Secondary | ICD-10-CM | POA: Diagnosis not present

## 2020-09-12 DIAGNOSIS — F1420 Cocaine dependence, uncomplicated: Secondary | ICD-10-CM | POA: Diagnosis not present

## 2020-09-12 DIAGNOSIS — F1220 Cannabis dependence, uncomplicated: Secondary | ICD-10-CM | POA: Diagnosis not present

## 2020-09-12 DIAGNOSIS — F1020 Alcohol dependence, uncomplicated: Secondary | ICD-10-CM | POA: Diagnosis not present

## 2020-09-12 DIAGNOSIS — F1120 Opioid dependence, uncomplicated: Secondary | ICD-10-CM | POA: Diagnosis not present

## 2020-09-15 DIAGNOSIS — F1120 Opioid dependence, uncomplicated: Secondary | ICD-10-CM | POA: Diagnosis not present

## 2020-09-15 DIAGNOSIS — F1220 Cannabis dependence, uncomplicated: Secondary | ICD-10-CM | POA: Diagnosis not present

## 2020-09-15 DIAGNOSIS — F1520 Other stimulant dependence, uncomplicated: Secondary | ICD-10-CM | POA: Diagnosis not present

## 2020-09-15 DIAGNOSIS — F1020 Alcohol dependence, uncomplicated: Secondary | ICD-10-CM | POA: Diagnosis not present

## 2020-09-15 DIAGNOSIS — F1420 Cocaine dependence, uncomplicated: Secondary | ICD-10-CM | POA: Diagnosis not present

## 2020-09-16 DIAGNOSIS — F1120 Opioid dependence, uncomplicated: Secondary | ICD-10-CM | POA: Diagnosis not present

## 2020-09-16 DIAGNOSIS — F1520 Other stimulant dependence, uncomplicated: Secondary | ICD-10-CM | POA: Diagnosis not present

## 2020-09-17 DIAGNOSIS — F1020 Alcohol dependence, uncomplicated: Secondary | ICD-10-CM | POA: Diagnosis not present

## 2020-09-17 DIAGNOSIS — F1220 Cannabis dependence, uncomplicated: Secondary | ICD-10-CM | POA: Diagnosis not present

## 2020-09-17 DIAGNOSIS — F1120 Opioid dependence, uncomplicated: Secondary | ICD-10-CM | POA: Diagnosis not present

## 2020-09-17 DIAGNOSIS — F1420 Cocaine dependence, uncomplicated: Secondary | ICD-10-CM | POA: Diagnosis not present

## 2020-09-17 DIAGNOSIS — F1520 Other stimulant dependence, uncomplicated: Secondary | ICD-10-CM | POA: Diagnosis not present

## 2020-09-18 DIAGNOSIS — F1120 Opioid dependence, uncomplicated: Secondary | ICD-10-CM | POA: Diagnosis not present

## 2020-09-18 DIAGNOSIS — F1520 Other stimulant dependence, uncomplicated: Secondary | ICD-10-CM | POA: Diagnosis not present

## 2020-09-19 DIAGNOSIS — F1020 Alcohol dependence, uncomplicated: Secondary | ICD-10-CM | POA: Diagnosis not present

## 2020-09-19 DIAGNOSIS — F1420 Cocaine dependence, uncomplicated: Secondary | ICD-10-CM | POA: Diagnosis not present

## 2020-09-19 DIAGNOSIS — F1220 Cannabis dependence, uncomplicated: Secondary | ICD-10-CM | POA: Diagnosis not present

## 2020-09-19 DIAGNOSIS — F1520 Other stimulant dependence, uncomplicated: Secondary | ICD-10-CM | POA: Diagnosis not present

## 2020-09-19 DIAGNOSIS — F1120 Opioid dependence, uncomplicated: Secondary | ICD-10-CM | POA: Diagnosis not present

## 2020-09-22 DIAGNOSIS — F1520 Other stimulant dependence, uncomplicated: Secondary | ICD-10-CM | POA: Diagnosis not present

## 2020-09-22 DIAGNOSIS — F1420 Cocaine dependence, uncomplicated: Secondary | ICD-10-CM | POA: Diagnosis not present

## 2020-09-22 DIAGNOSIS — F1020 Alcohol dependence, uncomplicated: Secondary | ICD-10-CM | POA: Diagnosis not present

## 2020-09-22 DIAGNOSIS — F1120 Opioid dependence, uncomplicated: Secondary | ICD-10-CM | POA: Diagnosis not present

## 2020-09-22 DIAGNOSIS — F1220 Cannabis dependence, uncomplicated: Secondary | ICD-10-CM | POA: Diagnosis not present

## 2020-09-23 DIAGNOSIS — F1520 Other stimulant dependence, uncomplicated: Secondary | ICD-10-CM | POA: Diagnosis not present

## 2020-09-23 DIAGNOSIS — F1120 Opioid dependence, uncomplicated: Secondary | ICD-10-CM | POA: Diagnosis not present

## 2020-09-24 DIAGNOSIS — F1120 Opioid dependence, uncomplicated: Secondary | ICD-10-CM | POA: Diagnosis not present

## 2020-09-24 DIAGNOSIS — F1020 Alcohol dependence, uncomplicated: Secondary | ICD-10-CM | POA: Diagnosis not present

## 2020-09-24 DIAGNOSIS — F1520 Other stimulant dependence, uncomplicated: Secondary | ICD-10-CM | POA: Diagnosis not present

## 2020-09-24 DIAGNOSIS — F1220 Cannabis dependence, uncomplicated: Secondary | ICD-10-CM | POA: Diagnosis not present

## 2020-09-24 DIAGNOSIS — F1420 Cocaine dependence, uncomplicated: Secondary | ICD-10-CM | POA: Diagnosis not present

## 2020-09-25 DIAGNOSIS — F1120 Opioid dependence, uncomplicated: Secondary | ICD-10-CM | POA: Diagnosis not present

## 2020-09-25 DIAGNOSIS — F1520 Other stimulant dependence, uncomplicated: Secondary | ICD-10-CM | POA: Diagnosis not present

## 2020-09-26 DIAGNOSIS — F1020 Alcohol dependence, uncomplicated: Secondary | ICD-10-CM | POA: Diagnosis not present

## 2020-09-26 DIAGNOSIS — F1520 Other stimulant dependence, uncomplicated: Secondary | ICD-10-CM | POA: Diagnosis not present

## 2020-09-26 DIAGNOSIS — F1120 Opioid dependence, uncomplicated: Secondary | ICD-10-CM | POA: Diagnosis not present

## 2020-09-26 DIAGNOSIS — F1420 Cocaine dependence, uncomplicated: Secondary | ICD-10-CM | POA: Diagnosis not present

## 2020-09-26 DIAGNOSIS — F1220 Cannabis dependence, uncomplicated: Secondary | ICD-10-CM | POA: Diagnosis not present

## 2020-09-29 DIAGNOSIS — F1020 Alcohol dependence, uncomplicated: Secondary | ICD-10-CM | POA: Diagnosis not present

## 2020-09-29 DIAGNOSIS — F1120 Opioid dependence, uncomplicated: Secondary | ICD-10-CM | POA: Diagnosis not present

## 2020-09-29 DIAGNOSIS — F1220 Cannabis dependence, uncomplicated: Secondary | ICD-10-CM | POA: Diagnosis not present

## 2020-09-29 DIAGNOSIS — F1420 Cocaine dependence, uncomplicated: Secondary | ICD-10-CM | POA: Diagnosis not present

## 2020-09-30 DIAGNOSIS — F1520 Other stimulant dependence, uncomplicated: Secondary | ICD-10-CM | POA: Diagnosis not present

## 2020-09-30 DIAGNOSIS — F1120 Opioid dependence, uncomplicated: Secondary | ICD-10-CM | POA: Diagnosis not present

## 2020-10-01 DIAGNOSIS — F1520 Other stimulant dependence, uncomplicated: Secondary | ICD-10-CM | POA: Diagnosis not present

## 2020-10-01 DIAGNOSIS — F1220 Cannabis dependence, uncomplicated: Secondary | ICD-10-CM | POA: Diagnosis not present

## 2020-10-01 DIAGNOSIS — F1020 Alcohol dependence, uncomplicated: Secondary | ICD-10-CM | POA: Diagnosis not present

## 2020-10-01 DIAGNOSIS — F1420 Cocaine dependence, uncomplicated: Secondary | ICD-10-CM | POA: Diagnosis not present

## 2020-10-01 DIAGNOSIS — F1120 Opioid dependence, uncomplicated: Secondary | ICD-10-CM | POA: Diagnosis not present

## 2020-10-02 DIAGNOSIS — F1520 Other stimulant dependence, uncomplicated: Secondary | ICD-10-CM | POA: Diagnosis not present

## 2020-10-02 DIAGNOSIS — F1120 Opioid dependence, uncomplicated: Secondary | ICD-10-CM | POA: Diagnosis not present

## 2020-10-03 DIAGNOSIS — F1520 Other stimulant dependence, uncomplicated: Secondary | ICD-10-CM | POA: Diagnosis not present

## 2020-10-03 DIAGNOSIS — F1420 Cocaine dependence, uncomplicated: Secondary | ICD-10-CM | POA: Diagnosis not present

## 2020-10-03 DIAGNOSIS — F1120 Opioid dependence, uncomplicated: Secondary | ICD-10-CM | POA: Diagnosis not present

## 2020-10-03 DIAGNOSIS — F1220 Cannabis dependence, uncomplicated: Secondary | ICD-10-CM | POA: Diagnosis not present

## 2020-10-03 DIAGNOSIS — F1020 Alcohol dependence, uncomplicated: Secondary | ICD-10-CM | POA: Diagnosis not present

## 2020-10-06 DIAGNOSIS — F1520 Other stimulant dependence, uncomplicated: Secondary | ICD-10-CM | POA: Diagnosis not present

## 2020-10-06 DIAGNOSIS — F1220 Cannabis dependence, uncomplicated: Secondary | ICD-10-CM | POA: Diagnosis not present

## 2020-10-06 DIAGNOSIS — F1120 Opioid dependence, uncomplicated: Secondary | ICD-10-CM | POA: Diagnosis not present

## 2020-10-06 DIAGNOSIS — F1020 Alcohol dependence, uncomplicated: Secondary | ICD-10-CM | POA: Diagnosis not present

## 2020-10-06 DIAGNOSIS — F1420 Cocaine dependence, uncomplicated: Secondary | ICD-10-CM | POA: Diagnosis not present

## 2020-10-07 DIAGNOSIS — F1520 Other stimulant dependence, uncomplicated: Secondary | ICD-10-CM | POA: Diagnosis not present

## 2020-10-07 DIAGNOSIS — F1120 Opioid dependence, uncomplicated: Secondary | ICD-10-CM | POA: Diagnosis not present

## 2020-10-08 DIAGNOSIS — F1120 Opioid dependence, uncomplicated: Secondary | ICD-10-CM | POA: Diagnosis not present

## 2020-10-08 DIAGNOSIS — F1020 Alcohol dependence, uncomplicated: Secondary | ICD-10-CM | POA: Diagnosis not present

## 2020-10-08 DIAGNOSIS — F1220 Cannabis dependence, uncomplicated: Secondary | ICD-10-CM | POA: Diagnosis not present

## 2020-10-08 DIAGNOSIS — F1420 Cocaine dependence, uncomplicated: Secondary | ICD-10-CM | POA: Diagnosis not present

## 2020-10-08 DIAGNOSIS — F1520 Other stimulant dependence, uncomplicated: Secondary | ICD-10-CM | POA: Diagnosis not present

## 2020-10-09 DIAGNOSIS — F1120 Opioid dependence, uncomplicated: Secondary | ICD-10-CM | POA: Diagnosis not present

## 2020-10-09 DIAGNOSIS — F1520 Other stimulant dependence, uncomplicated: Secondary | ICD-10-CM | POA: Diagnosis not present

## 2020-10-10 DIAGNOSIS — F1220 Cannabis dependence, uncomplicated: Secondary | ICD-10-CM | POA: Diagnosis not present

## 2020-10-10 DIAGNOSIS — F1520 Other stimulant dependence, uncomplicated: Secondary | ICD-10-CM | POA: Diagnosis not present

## 2020-10-10 DIAGNOSIS — F1420 Cocaine dependence, uncomplicated: Secondary | ICD-10-CM | POA: Diagnosis not present

## 2020-10-10 DIAGNOSIS — F1020 Alcohol dependence, uncomplicated: Secondary | ICD-10-CM | POA: Diagnosis not present

## 2020-10-10 DIAGNOSIS — F1120 Opioid dependence, uncomplicated: Secondary | ICD-10-CM | POA: Diagnosis not present

## 2020-10-13 DIAGNOSIS — F1220 Cannabis dependence, uncomplicated: Secondary | ICD-10-CM | POA: Diagnosis not present

## 2020-10-13 DIAGNOSIS — F1420 Cocaine dependence, uncomplicated: Secondary | ICD-10-CM | POA: Diagnosis not present

## 2020-10-13 DIAGNOSIS — F1020 Alcohol dependence, uncomplicated: Secondary | ICD-10-CM | POA: Diagnosis not present

## 2020-10-13 DIAGNOSIS — F1120 Opioid dependence, uncomplicated: Secondary | ICD-10-CM | POA: Diagnosis not present

## 2020-10-13 DIAGNOSIS — F1520 Other stimulant dependence, uncomplicated: Secondary | ICD-10-CM | POA: Diagnosis not present

## 2020-10-14 DIAGNOSIS — F1520 Other stimulant dependence, uncomplicated: Secondary | ICD-10-CM | POA: Diagnosis not present

## 2020-10-14 DIAGNOSIS — F1120 Opioid dependence, uncomplicated: Secondary | ICD-10-CM | POA: Diagnosis not present

## 2020-10-15 DIAGNOSIS — F1220 Cannabis dependence, uncomplicated: Secondary | ICD-10-CM | POA: Diagnosis not present

## 2020-10-15 DIAGNOSIS — F1120 Opioid dependence, uncomplicated: Secondary | ICD-10-CM | POA: Diagnosis not present

## 2020-10-15 DIAGNOSIS — F1520 Other stimulant dependence, uncomplicated: Secondary | ICD-10-CM | POA: Diagnosis not present

## 2020-10-15 DIAGNOSIS — F1420 Cocaine dependence, uncomplicated: Secondary | ICD-10-CM | POA: Diagnosis not present

## 2020-10-15 DIAGNOSIS — F1020 Alcohol dependence, uncomplicated: Secondary | ICD-10-CM | POA: Diagnosis not present

## 2020-10-16 DIAGNOSIS — F1120 Opioid dependence, uncomplicated: Secondary | ICD-10-CM | POA: Diagnosis not present

## 2020-10-16 DIAGNOSIS — F1520 Other stimulant dependence, uncomplicated: Secondary | ICD-10-CM | POA: Diagnosis not present

## 2020-10-21 DIAGNOSIS — F1120 Opioid dependence, uncomplicated: Secondary | ICD-10-CM | POA: Diagnosis not present

## 2020-10-22 DIAGNOSIS — F1120 Opioid dependence, uncomplicated: Secondary | ICD-10-CM | POA: Diagnosis not present

## 2020-10-31 DIAGNOSIS — F4323 Adjustment disorder with mixed anxiety and depressed mood: Secondary | ICD-10-CM | POA: Diagnosis not present

## 2020-10-31 DIAGNOSIS — F1020 Alcohol dependence, uncomplicated: Secondary | ICD-10-CM | POA: Diagnosis not present

## 2020-10-31 DIAGNOSIS — F1120 Opioid dependence, uncomplicated: Secondary | ICD-10-CM | POA: Diagnosis not present

## 2020-11-07 DIAGNOSIS — F1020 Alcohol dependence, uncomplicated: Secondary | ICD-10-CM | POA: Diagnosis not present

## 2020-11-07 DIAGNOSIS — F4323 Adjustment disorder with mixed anxiety and depressed mood: Secondary | ICD-10-CM | POA: Diagnosis not present

## 2020-11-07 DIAGNOSIS — F1120 Opioid dependence, uncomplicated: Secondary | ICD-10-CM | POA: Diagnosis not present

## 2020-11-14 DIAGNOSIS — F1120 Opioid dependence, uncomplicated: Secondary | ICD-10-CM | POA: Diagnosis not present

## 2020-11-18 DIAGNOSIS — F4323 Adjustment disorder with mixed anxiety and depressed mood: Secondary | ICD-10-CM | POA: Diagnosis not present

## 2020-11-18 DIAGNOSIS — F1020 Alcohol dependence, uncomplicated: Secondary | ICD-10-CM | POA: Diagnosis not present

## 2020-11-18 DIAGNOSIS — F1120 Opioid dependence, uncomplicated: Secondary | ICD-10-CM | POA: Diagnosis not present

## 2020-11-21 DIAGNOSIS — F4323 Adjustment disorder with mixed anxiety and depressed mood: Secondary | ICD-10-CM | POA: Diagnosis not present

## 2020-11-21 DIAGNOSIS — F1120 Opioid dependence, uncomplicated: Secondary | ICD-10-CM | POA: Diagnosis not present

## 2020-11-21 DIAGNOSIS — F1020 Alcohol dependence, uncomplicated: Secondary | ICD-10-CM | POA: Diagnosis not present

## 2020-11-28 DIAGNOSIS — F1120 Opioid dependence, uncomplicated: Secondary | ICD-10-CM | POA: Diagnosis not present

## 2020-11-28 DIAGNOSIS — F4323 Adjustment disorder with mixed anxiety and depressed mood: Secondary | ICD-10-CM | POA: Diagnosis not present

## 2020-11-28 DIAGNOSIS — F1020 Alcohol dependence, uncomplicated: Secondary | ICD-10-CM | POA: Diagnosis not present

## 2020-12-05 DIAGNOSIS — F1120 Opioid dependence, uncomplicated: Secondary | ICD-10-CM | POA: Diagnosis not present

## 2020-12-05 DIAGNOSIS — F1020 Alcohol dependence, uncomplicated: Secondary | ICD-10-CM | POA: Diagnosis not present

## 2020-12-05 DIAGNOSIS — F4323 Adjustment disorder with mixed anxiety and depressed mood: Secondary | ICD-10-CM | POA: Diagnosis not present

## 2020-12-19 DIAGNOSIS — F1120 Opioid dependence, uncomplicated: Secondary | ICD-10-CM | POA: Diagnosis not present

## 2020-12-19 DIAGNOSIS — F1020 Alcohol dependence, uncomplicated: Secondary | ICD-10-CM | POA: Diagnosis not present

## 2020-12-19 DIAGNOSIS — F4323 Adjustment disorder with mixed anxiety and depressed mood: Secondary | ICD-10-CM | POA: Diagnosis not present

## 2020-12-26 DIAGNOSIS — F1120 Opioid dependence, uncomplicated: Secondary | ICD-10-CM | POA: Diagnosis not present

## 2020-12-26 DIAGNOSIS — F4323 Adjustment disorder with mixed anxiety and depressed mood: Secondary | ICD-10-CM | POA: Diagnosis not present

## 2020-12-26 DIAGNOSIS — F1020 Alcohol dependence, uncomplicated: Secondary | ICD-10-CM | POA: Diagnosis not present

## 2021-01-01 DIAGNOSIS — F1020 Alcohol dependence, uncomplicated: Secondary | ICD-10-CM | POA: Diagnosis not present

## 2021-01-01 DIAGNOSIS — F4323 Adjustment disorder with mixed anxiety and depressed mood: Secondary | ICD-10-CM | POA: Diagnosis not present

## 2021-01-01 DIAGNOSIS — F1120 Opioid dependence, uncomplicated: Secondary | ICD-10-CM | POA: Diagnosis not present

## 2021-01-16 DIAGNOSIS — F1020 Alcohol dependence, uncomplicated: Secondary | ICD-10-CM | POA: Diagnosis not present

## 2021-01-16 DIAGNOSIS — F1120 Opioid dependence, uncomplicated: Secondary | ICD-10-CM | POA: Diagnosis not present

## 2021-01-16 DIAGNOSIS — F4323 Adjustment disorder with mixed anxiety and depressed mood: Secondary | ICD-10-CM | POA: Diagnosis not present

## 2021-01-30 DIAGNOSIS — F4323 Adjustment disorder with mixed anxiety and depressed mood: Secondary | ICD-10-CM | POA: Diagnosis not present

## 2021-01-30 DIAGNOSIS — F1120 Opioid dependence, uncomplicated: Secondary | ICD-10-CM | POA: Diagnosis not present

## 2021-01-30 DIAGNOSIS — F1020 Alcohol dependence, uncomplicated: Secondary | ICD-10-CM | POA: Diagnosis not present

## 2021-02-12 DIAGNOSIS — G4700 Insomnia, unspecified: Secondary | ICD-10-CM | POA: Diagnosis not present

## 2021-02-12 DIAGNOSIS — R5383 Other fatigue: Secondary | ICD-10-CM | POA: Diagnosis not present

## 2021-02-12 DIAGNOSIS — F419 Anxiety disorder, unspecified: Secondary | ICD-10-CM | POA: Diagnosis not present

## 2021-02-18 DIAGNOSIS — F1020 Alcohol dependence, uncomplicated: Secondary | ICD-10-CM | POA: Diagnosis not present

## 2021-02-18 DIAGNOSIS — F4323 Adjustment disorder with mixed anxiety and depressed mood: Secondary | ICD-10-CM | POA: Diagnosis not present

## 2021-02-18 DIAGNOSIS — F1120 Opioid dependence, uncomplicated: Secondary | ICD-10-CM | POA: Diagnosis not present

## 2021-03-06 DIAGNOSIS — F1120 Opioid dependence, uncomplicated: Secondary | ICD-10-CM | POA: Diagnosis not present

## 2021-03-06 DIAGNOSIS — F4323 Adjustment disorder with mixed anxiety and depressed mood: Secondary | ICD-10-CM | POA: Diagnosis not present

## 2021-03-06 DIAGNOSIS — F1020 Alcohol dependence, uncomplicated: Secondary | ICD-10-CM | POA: Diagnosis not present

## 2022-06-26 IMAGING — US US ABDOMEN LIMITED RUQ/ASCITES
1 series · 14 of 25 positions shown · non-contrast
Comparison: Same day CT abdomen pelvis.

CLINICAL DATA: Upper abdominal pain, CT suspicious for gallbladder
inflammation.

EXAM:
ULTRASOUND ABDOMEN LIMITED RIGHT UPPER QUADRANT

[Series 1: us abdomen limited ruq (liver/gb) · 14 of 41 slices shown]
[im 1/41]
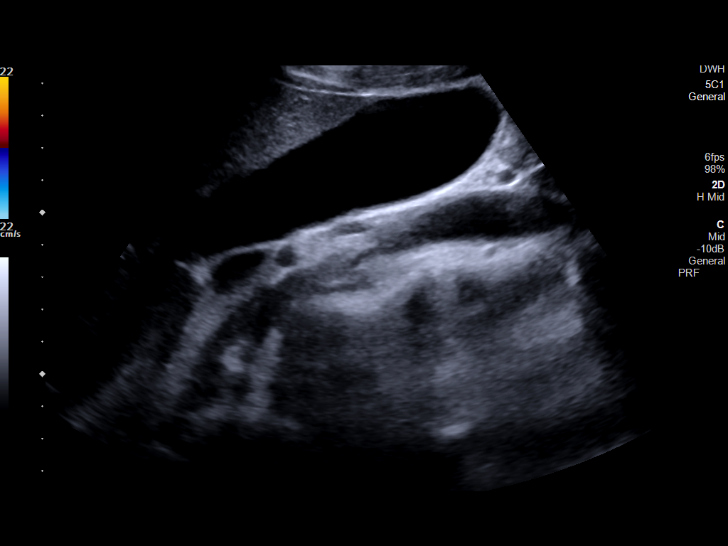
[im 4/41]
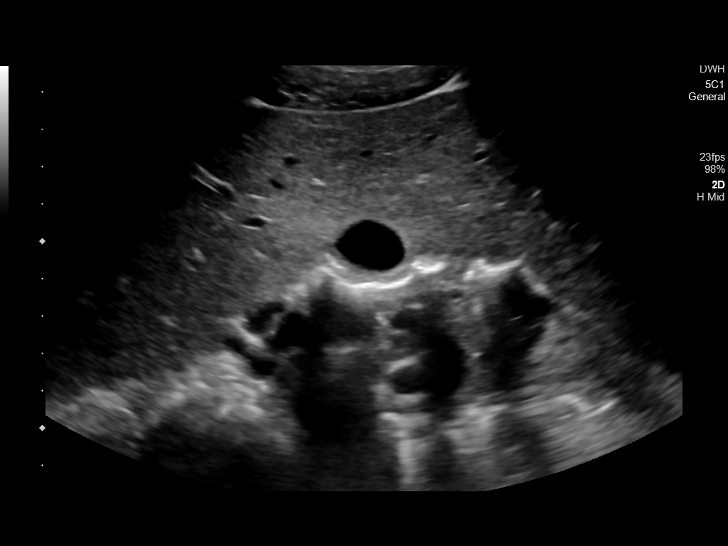
[im 7/41]
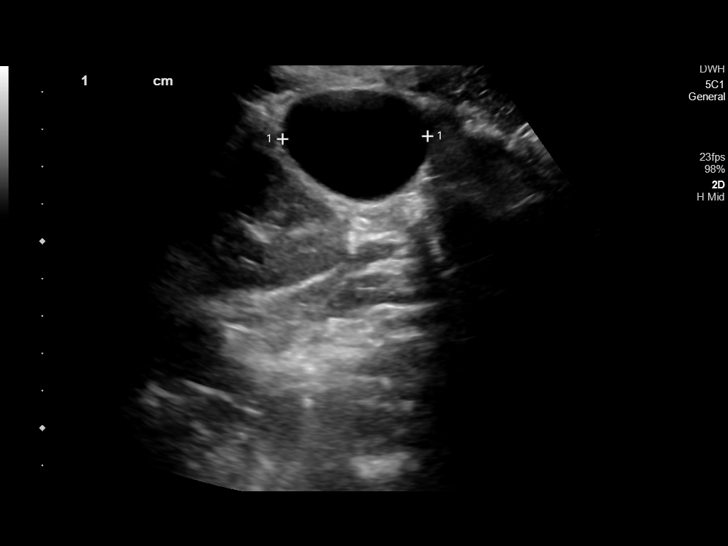
[im 11/41]
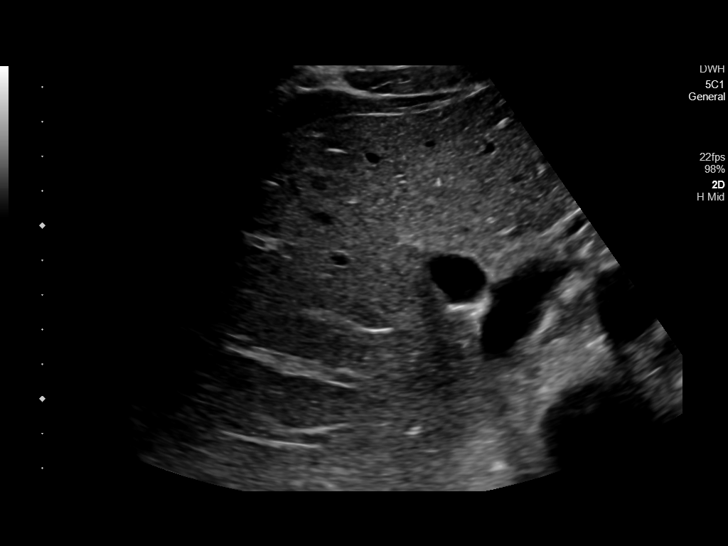
[im 14/41]
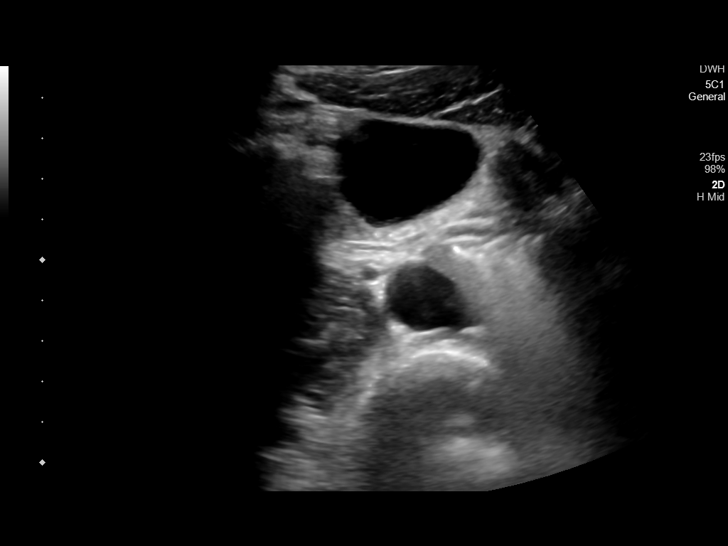
[im 16/41]
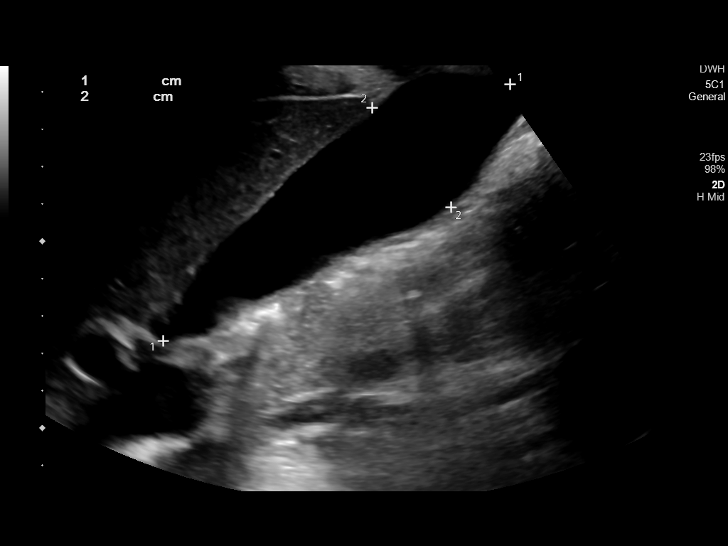
[im 19/41]
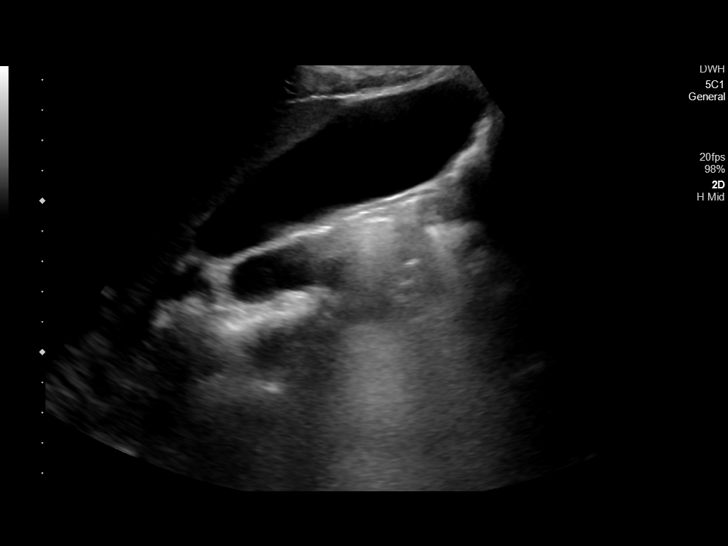
[im 22/41]
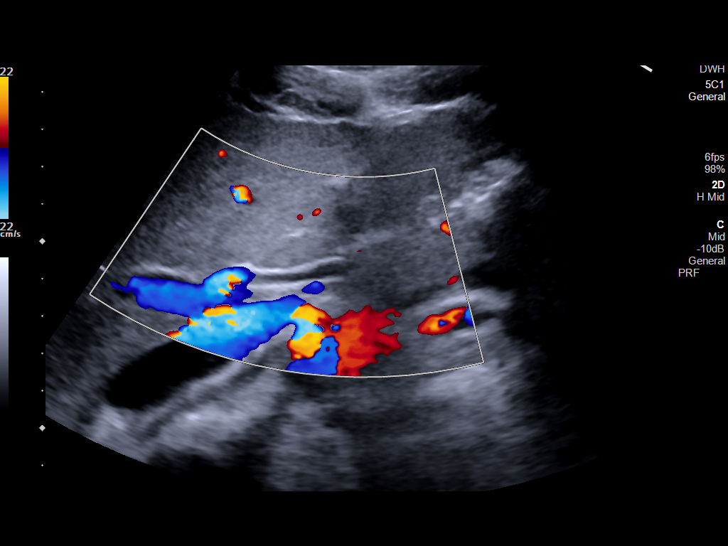
[im 26/41]
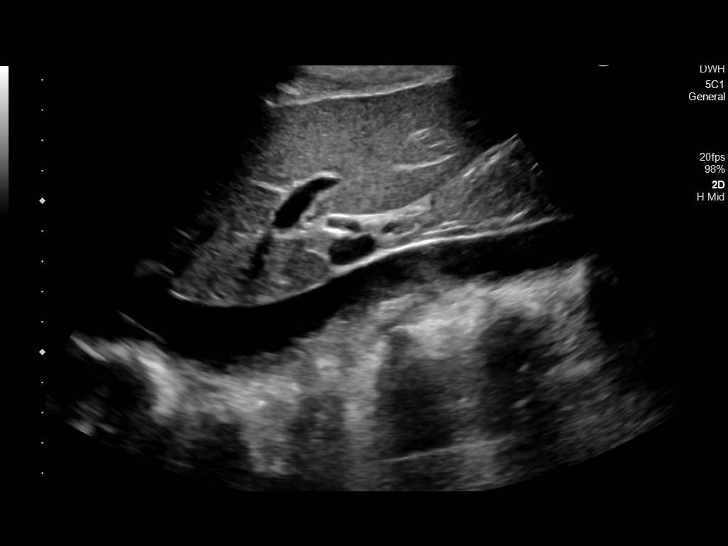
[im 27/41]
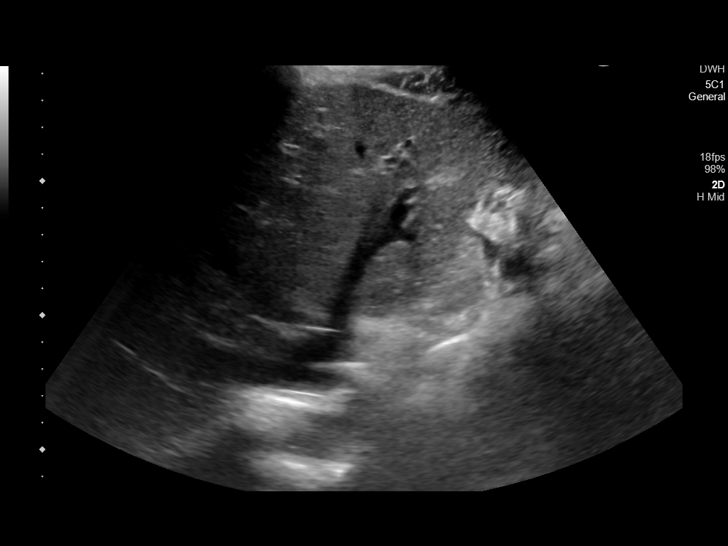
[im 31/41]
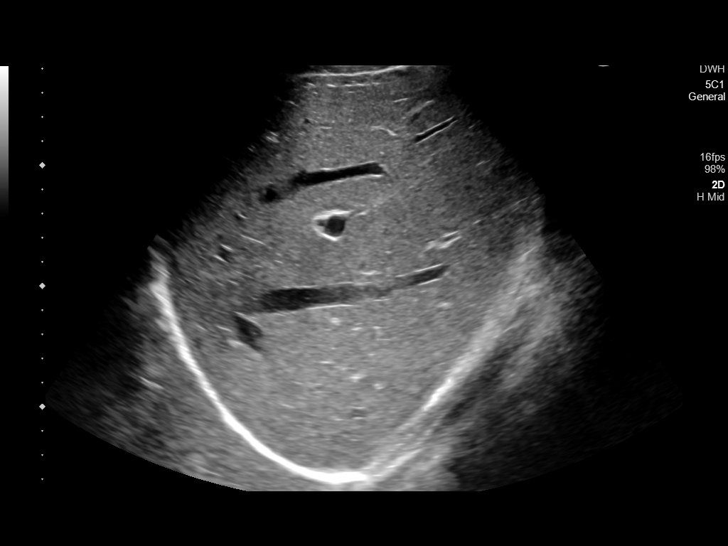
[im 34/41]
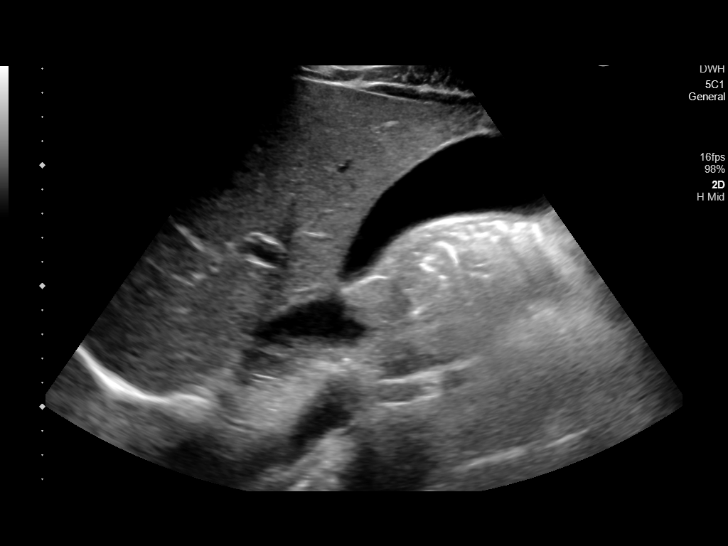
[im 37/41]
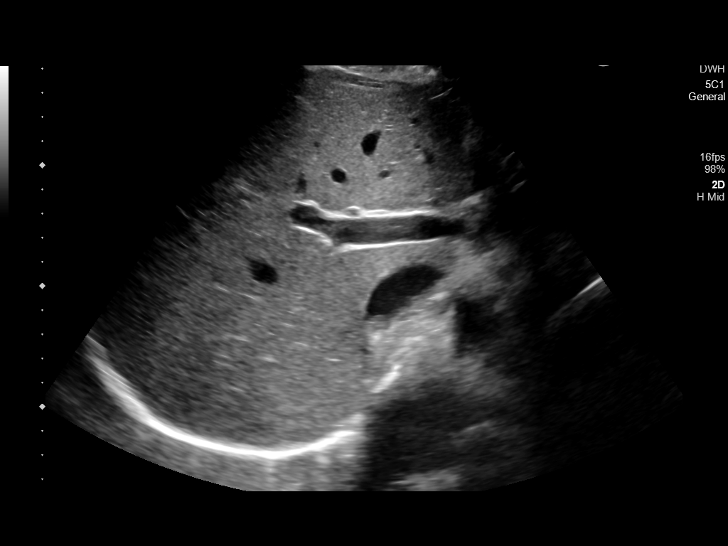
[im 41/41]
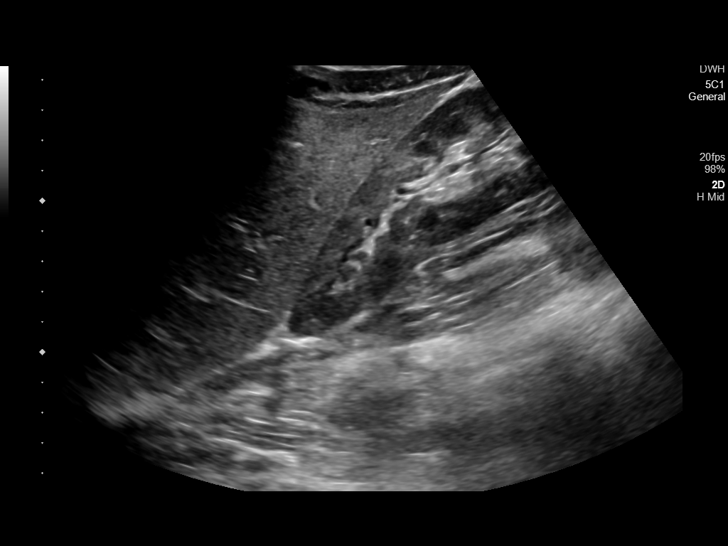

[14 of 25 positions shown; findings below may reference images not displayed]

FINDINGS: Gallbladder:

Gallbladder is distended measuring 11.6 x 3.4 cm. No gallstones,
pericholecystic fluid or wall thickening visualized. No sonographic
Murphy sign noted by sonographer.

Common bile duct:

Diameter: 4 mm

Liver:

No focal lesion identified. Within normal limits in parenchymal
echogenicity. Portal vein is patent on color Doppler imaging with
normal direction of blood flow towards the liver.

Other: None.
IMPRESSION: Distended gallbladder without cholelithiasis or sonographic evidence
of acute cholecystitis.
